# Patient Record
Sex: Female | Born: 1960 | Race: White | Hispanic: No | Marital: Married | Smoking: Current every day smoker
Health system: Southern US, Community
[De-identification: ages and names within clinical notes are randomized; demographics above are authoritative.]

## PROBLEM LIST (undated history)

## (undated) DIAGNOSIS — E039 Hypothyroidism, unspecified: Secondary | ICD-10-CM

## (undated) DIAGNOSIS — A419 Sepsis, unspecified organism: Secondary | ICD-10-CM

## (undated) DIAGNOSIS — E119 Type 2 diabetes mellitus without complications: Secondary | ICD-10-CM

## (undated) DIAGNOSIS — Z720 Tobacco use: Secondary | ICD-10-CM

## (undated) DIAGNOSIS — J45909 Unspecified asthma, uncomplicated: Secondary | ICD-10-CM

## (undated) DIAGNOSIS — I10 Essential (primary) hypertension: Secondary | ICD-10-CM

## (undated) DIAGNOSIS — M1990 Unspecified osteoarthritis, unspecified site: Secondary | ICD-10-CM

## (undated) DIAGNOSIS — E079 Disorder of thyroid, unspecified: Secondary | ICD-10-CM

## (undated) DIAGNOSIS — K760 Fatty (change of) liver, not elsewhere classified: Secondary | ICD-10-CM

## (undated) DIAGNOSIS — R590 Localized enlarged lymph nodes: Secondary | ICD-10-CM

## (undated) HISTORY — PX: TONSILLECTOMY: SUR1361

## (undated) HISTORY — PX: BREAST BIOPSY: SHX20

## (undated) HISTORY — PX: TUBAL LIGATION: SHX77

---

## 2008-08-22 ENCOUNTER — Ambulatory Visit: Payer: Self-pay | Admitting: Unknown Physician Specialty

## 2009-09-12 ENCOUNTER — Ambulatory Visit: Payer: Self-pay | Admitting: Unknown Physician Specialty

## 2011-09-14 ENCOUNTER — Ambulatory Visit: Payer: Self-pay | Admitting: Internal Medicine

## 2012-12-14 ENCOUNTER — Ambulatory Visit: Payer: Self-pay

## 2013-12-15 ENCOUNTER — Ambulatory Visit: Payer: Self-pay | Admitting: Internal Medicine

## 2015-01-30 ENCOUNTER — Ambulatory Visit: Payer: Self-pay | Admitting: Internal Medicine

## 2015-02-12 ENCOUNTER — Ambulatory Visit: Payer: Self-pay | Admitting: Internal Medicine

## 2015-04-22 LAB — SURGICAL PATHOLOGY

## 2015-06-15 ENCOUNTER — Encounter: Payer: Self-pay | Admitting: Emergency Medicine

## 2015-06-15 ENCOUNTER — Emergency Department: Payer: No Typology Code available for payment source

## 2015-06-15 ENCOUNTER — Observation Stay
Admission: EM | Admit: 2015-06-15 | Discharge: 2015-06-18 | Disposition: A | Discharge status: Home / Self Care | Payer: No Typology Code available for payment source | Attending: Internal Medicine | Admitting: Internal Medicine

## 2015-06-15 DIAGNOSIS — R509 Fever, unspecified: Secondary | ICD-10-CM | POA: Diagnosis not present

## 2015-06-15 DIAGNOSIS — R51 Headache: Secondary | ICD-10-CM | POA: Insufficient documentation

## 2015-06-15 DIAGNOSIS — Z888 Allergy status to other drugs, medicaments and biological substances status: Secondary | ICD-10-CM | POA: Diagnosis not present

## 2015-06-15 DIAGNOSIS — Z833 Family history of diabetes mellitus: Secondary | ICD-10-CM | POA: Diagnosis not present

## 2015-06-15 DIAGNOSIS — E785 Hyperlipidemia, unspecified: Secondary | ICD-10-CM | POA: Insufficient documentation

## 2015-06-15 DIAGNOSIS — E7800 Pure hypercholesterolemia, unspecified: Secondary | ICD-10-CM

## 2015-06-15 DIAGNOSIS — E876 Hypokalemia: Secondary | ICD-10-CM | POA: Diagnosis present

## 2015-06-15 DIAGNOSIS — D72829 Elevated white blood cell count, unspecified: Secondary | ICD-10-CM | POA: Insufficient documentation

## 2015-06-15 DIAGNOSIS — A419 Sepsis, unspecified organism: Secondary | ICD-10-CM | POA: Diagnosis present

## 2015-06-15 DIAGNOSIS — I10 Essential (primary) hypertension: Secondary | ICD-10-CM | POA: Insufficient documentation

## 2015-06-15 DIAGNOSIS — E039 Hypothyroidism, unspecified: Secondary | ICD-10-CM | POA: Diagnosis not present

## 2015-06-15 DIAGNOSIS — A938 Other specified arthropod-borne viral fevers: Secondary | ICD-10-CM | POA: Diagnosis not present

## 2015-06-15 DIAGNOSIS — R739 Hyperglycemia, unspecified: Secondary | ICD-10-CM | POA: Insufficient documentation

## 2015-06-15 DIAGNOSIS — F17200 Nicotine dependence, unspecified, uncomplicated: Secondary | ICD-10-CM | POA: Diagnosis not present

## 2015-06-15 DIAGNOSIS — Z79899 Other long term (current) drug therapy: Secondary | ICD-10-CM | POA: Diagnosis not present

## 2015-06-15 HISTORY — DX: Disorder of thyroid, unspecified: E07.9

## 2015-06-15 HISTORY — DX: Essential (primary) hypertension: I10

## 2015-06-15 LAB — BASIC METABOLIC PANEL
Anion gap: 10 (ref 5–15)
BUN: 13 mg/dL (ref 6–20)
CALCIUM: 9.4 mg/dL (ref 8.9–10.3)
CO2: 28 mmol/L (ref 22–32)
Chloride: 100 mmol/L — ABNORMAL LOW (ref 101–111)
Creatinine, Ser: 0.82 mg/dL (ref 0.44–1.00)
GFR calc Af Amer: >60 mL/min (ref >60)
GFR calc non Af Amer: >60 mL/min (ref >60)
GLUCOSE: 134 mg/dL — AB (ref 65–99)
POTASSIUM: 2.4 mmol/L — AB (ref 3.5–5.1)
Sodium: 138 mmol/L (ref 135–145)

## 2015-06-15 LAB — CBC WITH DIFFERENTIAL/PLATELET
BASOS ABS: 0.1 10*3/uL (ref 0–0.1)
Basophils Relative: 1 %
EOS ABS: 0 10*3/uL (ref 0–0.7)
Eosinophils Relative: 0 %
HCT: 44.8 % (ref 35.0–47.0)
Hemoglobin: 15.2 g/dL (ref 12.0–16.0)
Lymphocytes Relative: 13 %
Lymphs Abs: 1.6 10*3/uL (ref 1.0–3.6)
MCH: 31.5 pg (ref 26.0–34.0)
MCHC: 34 g/dL (ref 32.0–36.0)
MCV: 92.7 fL (ref 80.0–100.0)
MONOS PCT: 5 %
Monocytes Absolute: 0.6 10*3/uL (ref 0.2–0.9)
Neutro Abs: 10 10*3/uL — ABNORMAL HIGH (ref 1.4–6.5)
Neutrophils Relative %: 81 %
PLATELETS: 157 10*3/uL (ref 150–440)
RBC: 4.83 MIL/uL (ref 3.80–5.20)
RDW: 13.7 % (ref 11.5–14.5)
WBC: 12.2 10*3/uL — ABNORMAL HIGH (ref 3.6–11.0)

## 2015-06-15 LAB — TSH: TSH: 3.512 u[IU]/mL (ref 0.350–4.500)

## 2015-06-15 LAB — T4, FREE: Free T4: 0.5 ng/dL — ABNORMAL LOW (ref 0.61–1.12)

## 2015-06-15 MED ORDER — POTASSIUM CHLORIDE 20 MEQ PO PACK
PACK | ORAL | Status: AC
  Administered 2015-06-15: 40 meq via ORAL
  Filled 2015-06-15: qty 2

## 2015-06-15 MED ORDER — DOXYCYCLINE HYCLATE 100 MG PO TABS
100.0000 mg | ORAL_TABLET | Freq: Once | ORAL | Status: AC
  Administered 2015-06-15: 100 mg via ORAL

## 2015-06-15 MED ORDER — POTASSIUM CHLORIDE 20 MEQ PO PACK
40.0000 meq | PACK | Freq: Once | ORAL | Status: AC
  Administered 2015-06-15: 40 meq via ORAL

## 2015-06-15 MED ORDER — ACETAMINOPHEN 325 MG PO TABS
ORAL_TABLET | ORAL | Status: AC
  Filled 2015-06-15: qty 2

## 2015-06-15 MED ORDER — ACETAMINOPHEN 325 MG PO TABS
650.0000 mg | ORAL_TABLET | Freq: Four times a day (QID) | ORAL | Status: DC | PRN
  Administered 2015-06-16 – 2015-06-18 (×3): 650 mg via ORAL
  Filled 2015-06-15 (×3): qty 2

## 2015-06-15 MED ORDER — SODIUM CHLORIDE 0.9 % IJ SOLN
3.0000 mL | Freq: Two times a day (BID) | INTRAMUSCULAR | Status: DC
  Administered 2015-06-17: 21:00:00 3 mL via INTRAVENOUS

## 2015-06-15 MED ORDER — POTASSIUM CHLORIDE 10 MEQ/100ML IV SOLN
10.0000 meq | INTRAVENOUS | Status: AC
  Administered 2015-06-15 – 2015-06-16 (×4): 10 meq via INTRAVENOUS
  Filled 2015-06-15 (×4): qty 100

## 2015-06-15 MED ORDER — ONDANSETRON HCL 4 MG PO TABS
4.0000 mg | ORAL_TABLET | Freq: Four times a day (QID) | ORAL | Status: DC | PRN
  Administered 2015-06-16: 18:00:00 4 mg via ORAL
  Filled 2015-06-15: qty 1

## 2015-06-15 MED ORDER — ACETAMINOPHEN 500 MG PO TABS
1000.0000 mg | ORAL_TABLET | Freq: Once | ORAL | Status: AC
  Administered 2015-06-15: 1000 mg via ORAL

## 2015-06-15 MED ORDER — DOXYCYCLINE HYCLATE 100 MG PO TABS
ORAL_TABLET | ORAL | Status: AC
Start: 2015-06-15 — End: 2015-06-15
  Administered 2015-06-15: 100 mg via ORAL
  Filled 2015-06-15: qty 1

## 2015-06-15 MED ORDER — ACETAMINOPHEN 325 MG PO TABS: 650.0000 mg | ORAL_TABLET | Freq: Once | ORAL | Status: DC

## 2015-06-15 MED ORDER — ONDANSETRON HCL 4 MG PO TABS
ORAL_TABLET | ORAL | Status: AC
  Administered 2015-06-15: 4 mg
  Filled 2015-06-15: qty 1

## 2015-06-15 MED ORDER — ONDANSETRON 4 MG PO TBDP: 4.0000 mg | ORAL_TABLET | Freq: Once | ORAL | Status: DC

## 2015-06-15 MED ORDER — MORPHINE SULFATE 2 MG/ML IJ SOLN: 2.0000 mg | INTRAMUSCULAR | Status: DC | PRN

## 2015-06-15 MED ORDER — SODIUM CHLORIDE 0.9 % IV SOLN
INTRAVENOUS | Status: DC
  Administered 2015-06-15 – 2015-06-17 (×3): via INTRAVENOUS

## 2015-06-15 MED ORDER — HEPARIN SODIUM (PORCINE) 5000 UNIT/ML IJ SOLN
5000.0000 [IU] | Freq: Three times a day (TID) | INTRAMUSCULAR | Status: DC
  Administered 2015-06-16 – 2015-06-18 (×7): 5000 [IU] via SUBCUTANEOUS
  Filled 2015-06-15 (×7): qty 1

## 2015-06-15 MED ORDER — ONDANSETRON HCL 4 MG/2ML IJ SOLN: 4.0000 mg | Freq: Four times a day (QID) | INTRAMUSCULAR | Status: DC | PRN

## 2015-06-15 MED ORDER — ACETAMINOPHEN 650 MG RE SUPP: 650.0000 mg | Freq: Four times a day (QID) | RECTAL | Status: DC | PRN

## 2015-06-15 NOTE — ED Notes (Signed)
Report received from megan, rn.  

## 2015-06-15 NOTE — ED Notes (Signed)
Pt. Arrives in triage in wheelchair. Pt. States she noticed what might have been a tick on her right flank.  Pt. States she took it off and threw away.  Pt. States having fever today and feeling latheragic.  Pt. States feeling nauseated but has not vomitted.  Pt. Has a dime shaped ring on right side of abdominal area.

## 2015-06-15 NOTE — H&P (Signed)
[facility] [place] at [facility]   PATIENT NAME: Danielle Franklin    MR#:  [id number]  DATE OF BIRTH:  July 26, 1961   DATE OF ADMISSION:  06/15/2015  PRIMARY CARE PHYSICIAN: Danielle Franklin  REQUESTING/REFERRING PHYSICIAN: Scotty Franklin  CHIEF COMPLAINT:   Chief Complaint  Patient presents with  . Fever    HISTORY OF PRESENT ILLNESS:  Danielle Franklin  is a 54 y.o. female with a known history of hypothyroidism unspecified, essential hypertension presenting with fever. She discovered which he thought was a tick attached to her right flank about 24 hours ago, uncertain of when the original bite may have occurred. She is now experiencing fever, chills, nausea without emesis, fatigue and headache. She denies any sick contacts, any respiratory symptoms or further symptomatology  PAST MEDICAL HISTORY:  History reviewed. No pertinent past medical history.  PAST SURGICAL HISTORY:  History reviewed. No pertinent past surgical history.  SOCIAL HISTORY:   History  Substance Use Topics  . Smoking status: Current Every Day Smoker -- 1.00 packs/day  . Smokeless tobacco: Not on file  . Alcohol Use: No    FAMILY HISTORY:   Family History  Problem Relation Age of Onset  . Diabetes Other     DRUG ALLERGIES:   Allergies  Allergen Reactions  . Caduet [Amlodipine-Atorvastatin]     REVIEW OF SYSTEMS:  REVIEW OF SYSTEMS:  CONSTITUTIONAL: Positive fevers, chills, fatigue, weakness.  EYES: Denies blurred vision, double vision, or eye pain.  EARS, NOSE, THROAT: Denies tinnitus, ear pain, hearing loss.  RESPIRATORY: denies cough, shortness of breath, wheezing  CARDIOVASCULAR: Denies chest pain, palpitations, edema.  GASTROINTESTINAL: Denies nausea, vomiting, diarrhea, abdominal pain.  GENITOURINARY: Denies dysuria, hematuria.  ENDOCRINE: Denies nocturia or thyroid problems. HEMATOLOGIC AND LYMPHATIC: Denies easy bruising or bleeding.  SKIN: Small erythematous/ecchymotic  area right flank no further rash or lesions.  MUSCULOSKELETAL: Denies pain in neck, back, shoulder, knees, hips, or further arthritic symptoms.  NEUROLOGIC: Denies paralysis, paresthesias.  PSYCHIATRIC: Denies anxiety or depressive symptoms. Otherwise full review of systems performed by me is negative.   MEDICATIONS AT HOME:   Prior to Admission medications   Medication Sig Start Date End Date Taking? Authorizing Provider  atenolol (TENORMIN) 100 MG tablet Take 100 mg by mouth daily.   Yes Historical Provider, MD      VITAL SIGNS:  Blood pressure 134/77, pulse 130, temperature 100.7 F (38.2 C), temperature source Oral, resp. rate 20, height 5\' 4"  (1.626 m), weight 208 lb (94.348 kg), SpO2 95 %.  PHYSICAL EXAMINATION:  VITAL SIGNS: Filed Vitals:   06/15/15 1927  BP: 134/77  Pulse: 130  Temp: 100.7 F (38.2 C)  Resp: 20   GENERAL:53 y.o.female currently ill appearing  HEAD: Normocephalic, atraumatic.  EYES: Pupils equal, round, reactive to light. Extraocular muscles intact. No scleral icterus.  MOUTH: Moist mucosal membrane. Dentition intact. No abscess noted.  EAR, NOSE, THROAT: Clear without exudates. No external lesions.  NECK: Supple. No thyromegaly. No nodules. No JVD.  PULMONARY: Clear to ascultation, without wheeze rails or rhonci. No use of accessory muscles, Good respiratory effort. good air entry bilaterally CHEST: Nontender to palpation.  CARDIOVASCULAR: S1 and S2. Regular rate and rhythm. No murmurs, rubs, or gallops. No edema. Pedal pulses 2+ bilaterally.  GASTROINTESTINAL: Soft, nontender, nondistended. No masses. Positive bowel sounds. No hepatosplenomegaly.  MUSCULOSKELETAL: No swelling, clubbing, or edema. Range of motion full in all extremities.  NEUROLOGIC: Cranial nerves II through XII are intact. No gross focal  neurological deficits. Sensation intact. Reflexes intact.  SKIN:  small 1 x 1 cm ecchymotic area with central erythema right flank, Nofurther   ulceration, lesions, rashes, or cyanosis. Skin warm and dry. Turgor intact.  PSYCHIATRIC: Mood, affect within normal limits. The patient is awake, alert and oriented x 3. Insight, judgment intact.    LABORATORY PANEL:   CBC  Recent Labs Lab 06/15/15 2040  WBC 12.2*  HGB 15.2  HCT 44.8  PLT 157   ------------------------------------------------------------------------------------------------------------------  Chemistries   Recent Labs Lab 06/15/15 2040  NA 138  K 2.4*  CL 100*  CO2 28  GLUCOSE 134*  BUN 13  CREATININE 0.82  CALCIUM 9.4   ------------------------------------------------------------------------------------------------------------------  Cardiac Enzymes No results for input(s): TROPONINI in the last 168 hours. ------------------------------------------------------------------------------------------------------------------  RADIOLOGY:  Dg Chest 2 View  06/15/2015   CLINICAL DATA:  54 year old female with fever and lethargy. Possible recent tick bite. Initial encounter.  EXAM: CHEST  2 VIEW  COMPARISON:  None.  FINDINGS: Lung volumes are within normal limits. Normal cardiac size and mediastinal contours. Visualized tracheal air column is within normal limits. No pneumothorax or pulmonary edema. No pleural effusion or consolidation. No acute osseous abnormality identified. Mild scoliosis and exaggeration of lower thoracic kyphosis.  IMPRESSION: No acute cardiopulmonary abnormality.   Electronically Signed   By: Odessa Fleming M.D.   On: 06/15/2015 23:08    EKG:  No orders found for this or any previous visit.  IMPRESSION AND PLAN:   54 year old Caucasian female history of hypertension essential, hypothyroidism unspecified presenting with fever.  1. Sepsis, meeting septic criteria by heart rate, respiratory rate, leukocytosis present on arrival. Source unclear possibly tick borne illness panculture. Broad-spectrum antibiotics including doxycycline and taper  antibiotics when culture data returns.Continue IV fluid hydration to keep mean arterial pressure greater than 65.    2. Essential hypertension: Continue atenolol   3. Hypokalemia replace goal 4-5 4. Hypothyroidism unspecified: Continue home medications 5. Venous thromboembolism prophylactic: Heparin subcutaneous    All the records are reviewed and case discussed with ED provider. Management plans discussed with the patient, family and they are in agreement.  CODE STATUS: full  TOTAL TIME TAKING CARE OF THIS PATIENT: 35 minutes.    Hower,  Mardi Mainland.D on 06/15/2015 at 11:32 PM  Between 7am to 6pm - Pager - [phone]  After 6pm: House Pager: - [phone]  [facility]  Office  [phone]  CC: Primary care physician;  Danielle Franklin

## 2015-06-15 NOTE — ED Provider Notes (Addendum)
EKG interpreted by me Sinus tachycardia rate 108, normal axis intervals, normal QRS ST segments and T waves.  Sharman Cheek, MD 06/15/15 2209  Medical screening examination/treatment/procedure(s) were conducted as a shared visit with non-physician practitioner(s) and myself.  I personally evaluated the patient during the encounter.  Care discussed with the mid-level practitioner, patient interviewed and examined by myself. Patient has fever and tachycardia with a small skin lesion on the right abdomen. No significant focal tenderness. Lungs are clear, abdomen benign.  Patient presents with SIRS without a clear source. Labs only significant for severe hypokalemia. No clear infectious source indicates sepsis at this time. TSH is within normal and this does not appear to be consistent with thyroid storm.  Patient to be admitted to hospitalist services for further evaluation and management.  Sharman Cheek, MD 06/15/15 2245

## 2015-06-15 NOTE — ED Notes (Signed)
Pt on cardiac monitor.

## 2015-06-15 NOTE — ED Notes (Signed)
PA notified of critical potassium of 2.4

## 2015-06-15 NOTE — ED Notes (Signed)
Potassium reduced to 80ml/hr, pt complains of burning.

## 2015-06-15 NOTE — ED Provider Notes (Signed)
[facility] Emergency Department Provider Note ____________________________________________  Time seen: 2040  I have reviewed the triage vital signs and the nursing notes.  HISTORY  Chief Complaint  Fever  HPI Danielle Franklin is a 54 y.o. female reports to the ED escorted by her family, with complaints of fever and fatigue since yesterday. Of note is that she recalls pulling a very small tick off of her side yesterday prior to the onset of her symptoms. She also noted a small dime-sized bruise to the right abdomen, which she reports is not itchy or painful at this time. She is not sure if that is where she pulled the tick. Her fever started today, as well as some chills and nausea without vomiting and overall she feels more tired and states that that began yesterday. Her last oral intake was about 11 AM this morning when she was able to eat and keep down a hot pocket. She is here for evaluation and management of her symptoms. She gives a history of hypothyroidism and currently a every day smoker.  No past medical history on file.  Patient Active Problem List   Diagnosis Date Noted  . Hypothyroid 06/15/2015  . Elevated cholesterol 06/15/2015  . Sepsis 06/15/2015   No past surgical history on file.  Current Outpatient Rx  Name  Route  Sig  Dispense  Refill  . atenolol (TENORMIN) 100 MG tablet   Oral   Take 100 mg by mouth daily.          Allergies Caduet  No family history on file.  Social History History  Substance Use Topics  . Smoking status: Current Every Day Smoker -- 1.00 packs/day  . Smokeless tobacco: Not on file  . Alcohol Use: No   Review of Systems  Constitutional: Positive for fever and fatigue. Eyes: Negative for visual changes. ENT: Negative for sore throat. Cardiovascular: Negative for chest pain. Elevated heart rate. Respiratory: Negative for shortness of breath. Gastrointestinal: Negative for abdominal pain and diarrhea.  Positive for nausea. Genitourinary: Negative for dysuria. Musculoskeletal: Negative for back pain. Skin: Negative for rash.  Neurological: Negative for headaches, focal weakness or numbness. ____________________________________________  PHYSICAL EXAM:  VITAL SIGNS: ED Triage Vitals  Enc Vitals Group     BP 06/15/15 1927 134/77 mmHg     Pulse Rate 06/15/15 1927 130     Resp 06/15/15 1927 20     Temp 06/15/15 1927 100.7 F (38.2 C)     Temp Source 06/15/15 1927 Oral     SpO2 06/15/15 1927 95 %     Weight 06/15/15 1927 208 lb (94.348 kg)     Height 06/15/15 1927 5\' 4"  (1.626 m)     Head Cir --      Peak Flow --      Pain Score 06/15/15 1929 0     Pain Loc --      Pain Edu? --      Excl. in GC? --    Constitutional: Alert and oriented. Well appearing and in no distress. Eyes: Conjunctivae are normal. PERRL. Normal extraocular movements. ENT   Head: Normocephalic and atraumatic.   Nose: No congestion/rhinnorhea.   Mouth/Throat: Mucous membranes are moist.   Neck: Supple. No thyromegaly. Hematological/Lymphatic/Immunilogical: No cervical lymphadenopathy. Cardiovascular: Tachycardic rate, regular rhythm without murmur, palpitation, or gallup.  Respiratory: Normal respiratory effort. No wheezes/rales/rhonchi. Gastrointestinal: Soft and nontender. No distention. Musculoskeletal: Nontender with normal range of motion in all extremities.  Neurologic:  Normal gait without  ataxia. Normal speech and language. No gross focal neurologic deficits are appreciated. CN II-XII grossly intact Skin:  Skin is warm, dry and intact. No rash noted. Small, 1 cm, ecchymotic bruise to the right abdomen without signs of infection, central clearing, or abscess.  Psychiatric: Mood and affect are normal. Patient exhibits appropriate insight and judgment. ____________________________________________    LABS (pertinent positives/negatives) Labs Reviewed  CBC WITH DIFFERENTIAL/PLATELET -  Abnormal; Notable for the following:    WBC 12.2 (*)    Neutro Abs 10.0 (*)    All other components within normal limits  BASIC METABOLIC PANEL - Abnormal; Notable for the following:    Potassium 2.4 (*)    Chloride 100 (*)    Glucose, Bld 134 (*)    All other components within normal limits  T4, FREE - Abnormal; Notable for the following:    Free T4 0.50 (*)    All other components within normal limits  TSH  B. BURGDORFI ANTIBODIES  URINALYSIS COMPLETEWITH MICROSCOPIC ([facility] ONLY)  CBC  CREATININE, SERUM  Blood cultures x 2 - pending ___________________________________________  PROCEDURES Tylenol 1000 mg PO Zofran 4 mg PO Doxycycline  PO NS 1000 ml  ED ECG REPORT   Date: 06/15/2015  EKG Time:2042  Rate: 108  Rhythm: sinus tachycardia,  normal EKG, normal sinus rhythm, unchanged from previous tracings, there are no previous tracings available for comparison  Axis: normal  Intervals:none  ST&T Change: none  Narrative Interpretation: sinus tachy ____________________________________________  INITIAL IMPRESSION / ASSESSMENT AND PLAN / ED COURSE  Lab results to patient. Critical low potassium. Will discuss admission with attending for correction or lab with IV potassium. Clinical picture less consistent with tick disease given the less than 24-hour onset of symptoms after finding a small tick.   Discussed case with Dr. Scotty Court, and Dr. Clint Guy to evaluate for admission. ____________________________________________  FINAL CLINICAL IMPRESSION(S) / ED DIAGNOSES  Final diagnoses:  Acute hypokalemia     Lissa Hoard, PA-C 06/15/15 2325  Charlesetta Ivory [place], PA-C 06/15/15 2330  Sharman Cheek, MD 06/16/15 0005

## 2015-06-16 DIAGNOSIS — E876 Hypokalemia: Secondary | ICD-10-CM

## 2015-06-16 DIAGNOSIS — D72829 Elevated white blood cell count, unspecified: Secondary | ICD-10-CM

## 2015-06-16 DIAGNOSIS — A938 Other specified arthropod-borne viral fevers: Secondary | ICD-10-CM

## 2015-06-16 DIAGNOSIS — R739 Hyperglycemia, unspecified: Secondary | ICD-10-CM

## 2015-06-16 LAB — URINALYSIS COMPLETE WITH MICROSCOPIC (ARMC ONLY)
Bilirubin Urine: NEGATIVE
Glucose, UA: NEGATIVE mg/dL
HGB URINE DIPSTICK: NEGATIVE
Ketones, ur: NEGATIVE mg/dL
LEUKOCYTES UA: NEGATIVE
Nitrite: NEGATIVE
Protein, ur: NEGATIVE mg/dL
RBC / HPF: NONE SEEN RBC/hpf (ref 0–5)
Specific Gravity, Urine: 1.003 — ABNORMAL LOW (ref 1.005–1.030)
pH: 6 (ref 5.0–8.0)

## 2015-06-16 LAB — CBC WITH DIFFERENTIAL/PLATELET
Basophils Absolute: 0 10*3/uL (ref 0–0.1)
Basophils Relative: 0 %
EOS PCT: 0 %
Eosinophils Absolute: 0 10*3/uL (ref 0–0.7)
HEMATOCRIT: 40.4 % (ref 35.0–47.0)
HEMOGLOBIN: 13.5 g/dL (ref 12.0–16.0)
LYMPHS PCT: 15 %
Lymphs Abs: 1.6 10*3/uL (ref 1.0–3.6)
MCH: 31.4 pg (ref 26.0–34.0)
MCHC: 33.5 g/dL (ref 32.0–36.0)
MCV: 93.9 fL (ref 80.0–100.0)
Monocytes Absolute: 0.7 10*3/uL (ref 0.2–0.9)
Monocytes Relative: 7 %
NEUTROS ABS: 8 10*3/uL — AB (ref 1.4–6.5)
Neutrophils Relative %: 78 %
PLATELETS: 126 10*3/uL — AB (ref 150–440)
RBC: 4.3 MIL/uL (ref 3.80–5.20)
RDW: 14 % (ref 11.5–14.5)
WBC: 10.3 10*3/uL (ref 3.6–11.0)

## 2015-06-16 LAB — POTASSIUM: Potassium: 3.1 mmol/L — ABNORMAL LOW (ref 3.5–5.1)

## 2015-06-16 LAB — MAGNESIUM: MAGNESIUM: 1.5 mg/dL — AB (ref 1.7–2.4)

## 2015-06-16 MED ORDER — ATENOLOL 50 MG PO TABS
100.0000 mg | ORAL_TABLET | Freq: Every day | ORAL | Status: DC
  Administered 2015-06-16: 10:00:00 100 mg via ORAL
  Administered 2015-06-17: 08:00:00 50 mg via ORAL
  Filled 2015-06-16 (×3): qty 2

## 2015-06-16 MED ORDER — IBUPROFEN 600 MG PO TABS
600.0000 mg | ORAL_TABLET | Freq: Four times a day (QID) | ORAL | Status: DC | PRN
  Administered 2015-06-16: 600 mg via ORAL
  Filled 2015-06-16: qty 1

## 2015-06-16 MED ORDER — ATENOLOL 100 MG PO TABS: 100.0000 mg | ORAL_TABLET | Freq: Every day | ORAL | Status: DC

## 2015-06-16 MED ORDER — DOXYCYCLINE HYCLATE 100 MG PO TABS
100.0000 mg | ORAL_TABLET | Freq: Two times a day (BID) | ORAL | Status: DC
  Administered 2015-06-16 – 2015-06-17 (×3): 100 mg via ORAL
  Administered 2015-06-17: 08:00:00 50 mg via ORAL
  Administered 2015-06-18: 100 mg via ORAL
  Filled 2015-06-16 (×5): qty 1

## 2015-06-16 MED ORDER — ZOLPIDEM TARTRATE 5 MG PO TABS
5.0000 mg | ORAL_TABLET | Freq: Every evening | ORAL | Status: DC | PRN
  Administered 2015-06-17: 21:00:00 5 mg via ORAL
  Filled 2015-06-16: qty 1

## 2015-06-16 MED ORDER — DOXYCYCLINE HYCLATE 100 MG PO TABS: 100.0000 mg | ORAL_TABLET | Freq: Two times a day (BID) | ORAL | Status: DC

## 2015-06-16 MED ORDER — POTASSIUM CHLORIDE CRYS ER 20 MEQ PO TBCR
40.0000 meq | EXTENDED_RELEASE_TABLET | Freq: Once | ORAL | Status: AC
  Administered 2015-06-16: 40 meq via ORAL
  Filled 2015-06-16: qty 2

## 2015-06-16 MED ORDER — ONDANSETRON HCL 4 MG PO TABS: 4.0000 mg | ORAL_TABLET | Freq: Four times a day (QID) | ORAL | Status: DC | PRN

## 2015-06-16 NOTE — Progress Notes (Signed)
[facility] - [place] at [facility]   PATIENT NAME: Danielle Franklin    MR#:  [id number]  DATE OF BIRTH:  1961/08/28  SUBJECTIVE:  CHIEF COMPLAINT:   Chief Complaint  Patient presents with  . Fever   Patient presented with fever, chills, nausea, headache and feeling weak. She had recently noted tach to right flank, which left the bite site inflammation. Does not feel well today since did not sleep well at night.Denies any other clinical abnormalities . Admits of intermittent headaches as well as nausea. However, able to eat some breakfast .   Review of Systems  Constitutional: Positive for fever, chills and malaise/fatigue.  Neurological: Positive for weakness.    VITAL SIGNS: Blood pressure 107/51, pulse 102, temperature 100.1 F (37.8 C), temperature source Oral, resp. rate 18, height  (1.626 m), weight 95.89 kg (211 lb 6.4 oz), SpO2 91 %.  PHYSICAL EXAMINATION:   GENERAL:  54 y.o.-year-old patient lying in the bed with no acute distress. Weekend overall uncomfortable and the distent expression on her face  EYES: Pupils equal, round, reactive to light and accommodation. No scleral icterus. Extraocular muscles intact.  HEENT: Head atraumatic, normocephalic. Oropharynx and nasopharynx clear.  NECK:  Supple, no jugular venous distention. No thyroid enlargement, no tenderness.  LUNGS: Normal breath sounds bilaterally, no wheezing, rales,rhonchi or crepitation. No use of accessory muscles of respiration.  CARDIOVASCULAR: S1, S2 normal. No murmurs, rubs, or gallops.  ABDOMEN: Soft, nontender, nondistended. Bowel sounds present. No organomegaly or mass.  EXTREMITIES: No pedal edema, cyanosis, or clubbing.  NEUROLOGIC: Cranial nerves II through XII are intact. Muscle strength 5/5 in all extremities. Sensation intact. Gait not checked.  PSYCHIATRIC: The patient is alert and oriented x 3.  SKIN: No obvious rash, lesion, or ulcer. Papular bite site inflammation  with some bruising. No significant tenderness or any satellite rashes.   ORDERS/RESULTS REVIEWED:   CBC  Recent Labs Lab 06/15/15 2040  WBC 12.2*  HGB 15.2  HCT 44.8  PLT 157  MCV 92.7  MCH 31.5  MCHC 34.0  RDW 13.7  LYMPHSABS 1.6  MONOABS 0.6  EOSABS 0.0  BASOSABS 0.1   ------------------------------------------------------------------------------------------------------------------  Chemistries   Recent Labs Lab 06/15/15 2040  NA 138  K 2.4*  CL 100*  CO2 28  GLUCOSE 134*  BUN 13  CREATININE 0.82  CALCIUM 9.4   ------------------------------------------------------------------------------------------------------------------ estimated creatinine clearance is 88.2 mL/min (by C-G formula based on Cr of 0.82). ------------------------------------------------------------------------------------------------------------------  Recent Labs  06/15/15 2040  TSH 3.512    Cardiac Enzymes No results for input(s): CKMB, TROPONINI, MYOGLOBIN in the last 168 hours.  Invalid input(s): CK ------------------------------------------------------------------------------------------------------------------ Invalid input(s): POCBNP ---------------------------------------------------------------------------------------------------------------  RADIOLOGY: Dg Chest 2 View  06/15/2015   CLINICAL DATA:  54 year old female with fever and lethargy. Possible recent tick bite. Initial encounter.  EXAM: CHEST  2 VIEW  COMPARISON:  None.  FINDINGS: Lung volumes are within normal limits. Normal cardiac size and mediastinal contours. Visualized tracheal air column is within normal limits. No pneumothorax or pulmonary edema. No pleural effusion or consolidation. No acute osseous abnormality identified. Mild scoliosis and exaggeration of lower thoracic kyphosis.  IMPRESSION: No acute cardiopulmonary abnormality.   Electronically Signed   By: Odessa Fleming M.D.   On: 06/15/2015 23:08    EKG: No  orders found for this or any previous visit.  ASSESSMENT AND PLAN:  Active Problems:   Hypothyroid   Elevated cholesterol   Sepsis 1. Suspected tick fever, continue doxycycline to  complete 14 day course. Awaiting for serologic results for Lyme disease as well as [facility] spotted fever, also ehrlichia  Serology, follow-up with infectious disease specialist or primary care physician for serology results as outpatient 2. Hypokalemia. Check potassium level now, get magnesium level as well as patient was on chlorthalidone 3. Hyperglycemia. Get hemoglobin A1c 4 . Leukocytosis. Check white blood cell count with antibiotics therapy 5. Hyperlipidemia. Continue Mevacor    Management plans discussed with the patient, family and they are in agreement.   DRUG ALLERGIES:  Allergies  Allergen Reactions  . Caduet [Amlodipine-Atorvastatin]     CODE STATUS:     Code Status Orders        Start     Ordered   06/15/15 2325  Full code   Continuous     06/15/15 2324      TOTAL TIME TAKING CARE OF THIS PATIENT: 40  minutes.    Katharina Caper M.D on 06/16/2015 at 9:16 AM  Between 7am to 6pm - Pager - [phone]  After 6pm go to [email or web address] - password [email or web address]  [facility]  Office  [phone]  CC: Primary care physician; Pcp Not In System

## 2015-06-16 NOTE — Plan of Care (Signed)
Problem: Discharge Progression Outcomes Goal: Discharge plan in place and appropriate Individualization: Outcome: Progressing Individualization: Outcome: Progressing Pt lives at home with spouse and son.    Pt has hx of hypothyroidism and HTN, controlled by home meds. Pt is a moderate fall risk. Able to call for assistance if needed.

## 2015-06-16 NOTE — Plan of Care (Signed)
Problem: Discharge Progression Outcomes Goal: Discharge plan in place and appropriate Individualization: Outcome: Progressing Pt lives at home with spouse and son.   Pt has hx of hypothyroidism and HTN, controlled by home meds. Pt is a moderate fall risk. Able to call for assistance if needed.  Goal: Other Discharge Outcomes/Goals Outcome: Progressing Plan of care progress to goal: Discharge plan: pt will be discharged to home. Pain-pt has had no c/o pain since arrival to room. Hemodynamically stable-Potassium down to 2.4, PO and IV supplements being given per order, WBC's elevated 12.2, first dose of doxycycline given in the ED and IVF infusing per MD order. Continue to monitor labs.  Complications resolved- Potassium low, supplements given improvement pending morning lab results. Diet- Tolerating diet, no c/o nausea or vomiting.

## 2015-06-16 NOTE — Plan of Care (Signed)
Problem: Discharge Progression Outcomes Goal: Other Discharge Outcomes/Goals Outcome: Progressing Pt rested most of shift.  Spiked a fever of 102 - came down w/tylenol.  Awaiting lab results - testing for Lyme disease and [place] spotted fever.  K+ still low but improved to 3.1. WBC improved to 10.3.  NO N&V or c/o pain.

## 2015-06-16 NOTE — Plan of Care (Signed)
Problem: Discharge Progression Outcomes Goal: Discharge plan in place and appropriate Individualization:  Pt lives at home with spouse and son.  Pt has hx of hypothyroidism and HTN, controlled by home meds. Pt is a moderate fall risk. Able to call for assistance if needed.

## 2015-06-17 LAB — POTASSIUM: POTASSIUM: 3.5 mmol/L (ref 3.5–5.1)

## 2015-06-17 LAB — B. BURGDORFI ANTIBODIES: B burgdorferi Ab IgG+IgM: <0.91 {ISR} (ref 0.00–0.90)

## 2015-06-17 LAB — MAGNESIUM: MAGNESIUM: 2.5 mg/dL — AB (ref 1.7–2.4)

## 2015-06-17 MED ORDER — CHLORTHALIDONE 25 MG PO TABS
25.0000 mg | ORAL_TABLET | Freq: Every day | ORAL | Status: DC
  Administered 2015-06-17: 16:00:00 25 mg via ORAL
  Filled 2015-06-17 (×2): qty 1

## 2015-06-17 MED ORDER — MAGNESIUM SULFATE 4 GM/100ML IV SOLN
4.0000 g | Freq: Once | INTRAVENOUS | Status: AC
  Administered 2015-06-17: 11:00:00 4 g via INTRAVENOUS
  Filled 2015-06-17: qty 100

## 2015-06-17 MED ORDER — THYROID 60 MG PO TABS
120.0000 mg | ORAL_TABLET | Freq: Every day | ORAL | Status: DC
  Administered 2015-06-18: 120 mg via ORAL
  Filled 2015-06-17: qty 2
  Filled 2015-06-17: qty 1

## 2015-06-17 MED ORDER — PRAVASTATIN SODIUM 20 MG PO TABS
40.0000 mg | ORAL_TABLET | Freq: Every day | ORAL | Status: DC
  Administered 2015-06-17: 40 mg via ORAL
  Filled 2015-06-17: qty 2

## 2015-06-17 MED ORDER — POTASSIUM CHLORIDE CRYS ER 20 MEQ PO TBCR
40.0000 meq | EXTENDED_RELEASE_TABLET | Freq: Once | ORAL | Status: AC
  Administered 2015-06-17: 40 meq via ORAL
  Filled 2015-06-17: qty 2

## 2015-06-17 NOTE — Plan of Care (Signed)
Problem: Discharge Progression Outcomes Goal: Discharge plan in place and appropriate Individualization: Pt prefers to be called Danielle Franklin Pt lives at home with spouse and son.     Pt has hx of hypothyroidism and HTN, controlled by home meds. Pt is a moderate fall risk. Able to call for assistance if needed    Goal: Other Discharge Outcomes/Goals Plan of Care Progress to Goal:  Pt without c/o pain at this time. Pt lethargic but arousable. Pt tolerating pos well. tmax 99.1

## 2015-06-17 NOTE — Progress Notes (Addendum)
[facility] Physicians - [place] at [facility]   PATIENT NAME: Danielle Franklin    MR#:  [id number]  DATE OF BIRTH:  1961-09-13  SUBJECTIVE:  CHIEF COMPLAINT:   Chief Complaint  Patient presents with  . Fever   MAXIMUM TEMPERATURE of 103.1 last evening at 6:00, K & mg low.  Tachycardiac.  Feels tired REVIEW OF SYSTEMS:  Review of Systems  Constitutional: Positive for fever and malaise/fatigue. Negative for weight loss and diaphoresis.  HENT: Negative for ear discharge, ear pain, hearing loss, nosebleeds, sore throat and tinnitus.   Eyes: Negative for blurred vision and pain.  Respiratory: Negative for cough, hemoptysis, shortness of breath and wheezing.   Cardiovascular: Negative for chest pain, palpitations, orthopnea and leg swelling.  Gastrointestinal: Negative for heartburn, nausea, vomiting, abdominal pain, diarrhea, constipation and blood in stool.  Genitourinary: Negative for dysuria, urgency and frequency.  Musculoskeletal: Negative for myalgias and back pain.  Skin: Negative for itching and rash.  Neurological: Positive for weakness. Negative for dizziness, tingling, tremors, focal weakness, seizures and headaches.  Psychiatric/Behavioral: Negative for depression. The patient is not nervous/anxious.    DRUG ALLERGIES:   Allergies  Allergen Reactions  . Caduet [Amlodipine-Atorvastatin]    VITALS:  Blood pressure 156/69, pulse 112, temperature 98 F (36.7 C), temperature source Oral, resp. rate 18, height 5\' 4"  (1.626 m), weight 95.89 kg (211 lb 6.4 oz), SpO2 92 %. PHYSICAL EXAMINATION:  Physical Exam  Constitutional: She is oriented to person, place, and time and well-developed, well-nourished, and in no distress.  HENT:  Head: Normocephalic and atraumatic.  Eyes: Conjunctivae and EOM are normal. Pupils are equal, round, and reactive to light.  Neck: Normal range of motion. Neck supple. No tracheal deviation present. No thyromegaly present.  Cardiovascular:  Normal rate, regular rhythm and normal heart sounds.   Pulmonary/Chest: Effort normal and breath sounds normal. No respiratory distress. She has no wheezes. She exhibits no tenderness.  Abdominal: Soft. Bowel sounds are normal. She exhibits no distension. There is no tenderness.  Musculoskeletal: Normal range of motion.  Neurological: She is alert and oriented to person, place, and time. No cranial nerve deficit.  Skin: Skin is warm and dry. No rash noted.  Psychiatric: Mood and affect normal.   LABORATORY PANEL:   CBC  Recent Labs Lab 06/16/15 0935  WBC 10.3  HGB 13.5  HCT 40.4  PLT 126*   ------------------------------------------------------------------------------------------------------------------ Chemistries   Recent Labs Lab 06/15/15 2040 06/16/15 0935  NA 138  --   K 2.4* 3.1*  CL 100*  --   CO2 28  --   GLUCOSE 134*  --   BUN 13  --   CREATININE 0.82  --   CALCIUM 9.4  --   MG  --  1.5*   ASSESSMENT AND PLAN:   1. Suspected tick fever: Continue doxycycline to complete 14 day course. Pending serologic results for Lyme disease as well as [facility] spotted fever, also ehrlichia Serology, still had fever up to 103 last evening.  2. Hypokalemia: Replete and recheck, likely due to chlorthalidone  3. Leukocytosis: Improving white blood cell count with antibiotics therapy  5. Hyperlipidemia. Continue Mevacor  All the records are reviewed and case discussed with Care Management/Social Worker. Management plans discussed with the patient and she is in agreement.  CODE STATUS: Full Code  TOTAL TIME TAKING CARE OF THIS PATIENT: 25 minutes.   More than 50% of the time was spent in counseling/coordination of care: YES  POSSIBLE  D/C IN AM, DEPENDING ON CLINICAL CONDITION.  Would like to see fever curve improving and electrolytes repleted or close to normal   [facility], Illene Sweeting M.D on 06/17/2015 at 12:24 PM  Between 7am to 6pm - Pager - [phone]  After 6pm go  to [email or web address] - password [email or web address]  [facility]  Office  [phone]  CC:  Primary care physician; Pcp Not In System

## 2015-06-17 NOTE — Plan of Care (Signed)
Problem: Discharge Progression Outcomes Goal: Other Discharge Outcomes/Goals Outcome: Progressing Plan of care progress to goal: Discharge plan: pt will be discharged to home. Pain-No c/o pain this shift. Hemodynamically stable-WBC's improving, potassium improved but continues to be low at 3.1 IVF infusing per order. Doxycycline given per order. Continue to monitor labs.   Complications resolved- Febrile 101, improved with Ibuprofen. Diet- Tolerating diet, no c/o nausea or vomiting.

## 2015-06-18 LAB — ROCKY MTN SPOTTED FVR ABS PNL(IGG+IGM)
RMSF IGG: NEGATIVE
RMSF IgM: 0.24 index (ref 0.00–0.89)

## 2015-06-18 LAB — EHRLICHIA ANTIBODY PANEL
E chaffeensis (HGE) Ab, IgG: NEGATIVE
E chaffeensis (HGE) Ab, IgM: NEGATIVE
E. CHAFFEENSIS IGG AB: NEGATIVE
E. Chaffeensis (HME) IgM Titer: NEGATIVE

## 2015-06-18 LAB — MAGNESIUM: Magnesium: 2.3 mg/dL (ref 1.7–2.4)

## 2015-06-18 LAB — POTASSIUM: POTASSIUM: 3.5 mmol/L (ref 3.5–5.1)

## 2015-06-18 MED ORDER — ATENOLOL 100 MG PO TABS: 100.0000 mg | ORAL_TABLET | Freq: Every day | ORAL | Status: DC

## 2015-06-18 MED ORDER — DOXYCYCLINE HYCLATE 100 MG PO TABS: 100.0000 mg | ORAL_TABLET | Freq: Two times a day (BID) | ORAL | Status: DC

## 2015-06-18 NOTE — Plan of Care (Signed)
Problem: Discharge Progression Outcomes Goal: Other Discharge Outcomes/Goals Outcome: Completed/Met Date Met:  06/18/15 Received order to discharge patient to home No c/o pain this shift VSS  Patient Potasium and Thyroid levels are WNL Patient is tolerating diet well Discharged to home with family member IV removed

## 2015-06-18 NOTE — Discharge Summary (Signed)
[facility] Physicians - [place] at [facility]   PATIENT NAME: Danielle Franklin    MR#:  [id number]  DATE OF BIRTH:  [birth year]  DATE OF ADMISSION:  06/15/2015    DATE OF DISCHARGE: 06/18/2015   ADMITTING PHYSICIAN: Wyatt Haste, MD  PRIMARY CARE PHYSICIAN: Yates Decamp B III  ADMISSION DIAGNOSIS:  Acute hypokalemia [E87.6]  DISCHARGE DIAGNOSIS:  Principal Problem:   Sepsis Active Problems:   Hypothyroid   Elevated cholesterol   Tick borne fever   Hypokalemia   Hyperglycemia   Leukocytosis  SECONDARY DIAGNOSIS:   Past Medical History  Diagnosis Date  . Hypertension   . Thyroid disease     HOSPITAL COURSE:  Patient is a 54 year old female with above-mentioned medical problem was admitted for fever and sepsis.  Dr. Onalee Hua hower's dictated history and physical for further details.  This was thought to be secondary to tick bite.  Patient was started on doxycycline and she responded well to same.  Patient also have a low electrolytes including potassium and magnesium, which were repleted and resolved.  Her electrolyte disturbances were thought to be secondary to her being on diuretic.  her chlorthalidone was stopped and atenolol dose was increased for better blood pressure control.  She is doing much better and is being discharged home in stable condition.  She is agreeable with the discharge plans.    DISCHARGE CONDITIONS:   good  CONSULTS OBTAINED:    none  DRUG ALLERGIES:   Allergies  Allergen Reactions  . Caduet [Amlodipine-Atorvastatin]    DISCHARGE MEDICATIONS:   Current Discharge Medication List    START taking these medications   Details  doxycycline (VIBRA-TABS) 100 MG tablet Take 1 tablet (100 mg total) by mouth every 12 (twelve) hours. Qty: 24 tablet, Refills: 0    ondansetron (ZOFRAN) 4 MG tablet Take 1 tablet (4 mg total) by mouth every 6 (six) hours as needed for nausea. Qty: 20 tablet, Refills: 0      CONTINUE these medications  which have CHANGED   Details  atenolol (TENORMIN) 100 MG tablet Take 1 tablet (100 mg total) by mouth daily. Qty: 30 tablet, Refills: 0      CONTINUE these medications which have NOT CHANGED   Details  ARMOUR THYROID 90 MG tablet Take 3 tablets by mouth at bedtime.    lovastatin (MEVACOR) 40 MG tablet Take 1 tablet by mouth at bedtime.      STOP taking these medications     atenolol-chlorthalidone (TENORETIC) 50-25 MG per tablet          DIET:  Cardiac diet  DISCHARGE CONDITION:  Good  ACTIVITY:  Activity as tolerated  OXYGEN:  Home Oxygen: No.   Oxygen Delivery: room air  DISCHARGE LOCATION:  home   If you experience worsening of your admission symptoms, develop shortness of breath, life threatening emergency, suicidal or homicidal thoughts you must seek medical attention immediately by calling 911 or calling your MD immediately  if symptoms less severe.  You Must read complete instructions/literature along with all the possible adverse reactions/side effects for all the Medicines you take and that have been prescribed to you. Take any new Medicines after you have completely understood and accpet all the possible adverse reactions/side effects.   Please note  You were cared for by a hospitalist during your hospital stay. If you have any questions about your discharge medications or the care you received while you were in the hospital after you are discharged, you  can call the unit and asked to speak with the hospitalist on call if the hospitalist that took care of you is not available. Once you are discharged, your primary care physician will handle any further medical issues. Please note that NO REFILLS for any discharge medications will be authorized once you are discharged, as it is imperative that you return to your primary care physician (or establish a relationship with a primary care physician if you do not have one) for your aftercare needs so that they can  reassess your need for medications and monitor your lab values.   On the day of Discharge:   VITAL SIGNS:  Blood pressure 102/55, pulse 69, temperature 97.5 F (36.4 C), temperature source Oral, resp. rate 18, height  (1.626 m), weight 95.89 kg (211 lb 6.4 oz), SpO2 92 %.  I/O:   Intake/Output Summary (Last 24 hours) at 06/18/15 0716 Last data filed at 06/18/15 0402  Gross per 24 hour  Intake   1366 ml  Output   2175 ml  Net   -809 ml   PHYSICAL EXAMINATION:  GENERAL:  54 y.o.-year-old patient lying in the bed with no acute distress.  EYES: Pupils equal, round, reactive to light and accommodation. No scleral icterus. Extraocular muscles intact.  HEENT: Head atraumatic, normocephalic. Oropharynx and nasopharynx clear.  NECK:  Supple, no jugular venous distention. No thyroid enlargement, no tenderness.  LUNGS: Normal breath sounds bilaterally, no wheezing, rales,rhonchi or crepitation. No use of accessory muscles of respiration.  CARDIOVASCULAR: S1, S2 normal. No murmurs, rubs, or gallops.  ABDOMEN: Soft, non-tender, non-distended. Bowel sounds present. No organomegaly or mass.  EXTREMITIES: No pedal edema, cyanosis, or clubbing.  NEUROLOGIC: Cranial nerves II through XII are intact. Muscle strength 5/5 in all extremities. Sensation intact. Gait not checked.  PSYCHIATRIC: The patient is alert and oriented x 3.  SKIN: No obvious rash, lesion, or ulcer.  DATA REVIEW:   CBC  Recent Labs Lab 06/16/15 0935  WBC 10.3  HGB 13.5  HCT 40.4  PLT 126*    Chemistries   Recent Labs Lab 06/15/15 2040  06/18/15 0412  NA 138  --   --   K 2.4*  < > 3.5  CL 100*  --   --   CO2 28  --   --   GLUCOSE 134*  --   --   BUN 13  --   --   CREATININE 0.82  --   --   CALCIUM 9.4  --   --   MG  --   < > 2.3  < > = values in this interval not displayed.  Management plans discussed with the patient and she is in agreement.  CODE STATUS: Full Code  TOTAL TIME TAKING CARE OF THIS  PATIENT: 55  minutes.    [facility], Jacque Garrels M.D on 06/18/2015 at 7:16 AM  Between 7am to 6pm - Pager - [phone]  After 6pm go to [email or web address] - password [email or web address] [facility]  [facility]  Office  [phone]  CC: Primary care physician; Elmo Putt III

## 2015-06-18 NOTE — Discharge Instructions (Signed)
Tick Bite Information Ticks are insects that attach themselves to the skin. There are many types of ticks. Common types include wood ticks and deer ticks. Sometimes, ticks carry diseases that can make a person very ill. The most common places for ticks to attach themselves are the scalp, neck, armpits, waist, and groin.  HOW CAN YOU PREVENT TICK BITES? Take these steps to help prevent tick bites when you are outdoors:  Wear long sleeves and long pants.  Wear white clothes so you can see ticks more easily.  Tuck your pant legs into your socks.  If walking on a trail, stay in the middle of the trail to avoid brushing against bushes.  Avoid walking through areas with long grass.  Put bug spray on all skin that is showing and along boot tops, pant legs, and sleeve cuffs.  Check clothes, hair, and skin often and before going inside.  Brush off any ticks that are not attached.  Take a shower or bath as soon as possible after being outdoors. HOW SHOULD YOU REMOVE A TICK? Ticks should be removed as soon as possible to help prevent diseases. 1. If latex gloves are available, put them on before trying to remove a tick. 2. Use tweezers to grasp the tick as close to the skin as possible. You may also use curved forceps or a tick removal tool. Grasp the tick as close to its head as possible. Avoid grasping the tick on its body. 3. Pull gently upward until the tick lets go. Do not twist the tick or jerk it suddenly. This may break off the tick's head or mouth parts. 4. Do not squeeze or crush the tick's body. This could force disease-carrying fluids from the tick into your body. 5. After the tick is removed, wash the bite area and your hands with soap and water or alcohol. 6. Apply a small amount of antiseptic cream or ointment to the bite site. 7. Wash any tools that were used. Do not try to remove a tick by applying a hot match, petroleum jelly, or fingernail polish to the tick. These methods do  not work. They may also increase the chances of disease being spread from the tick bite. WHEN SHOULD YOU SEEK HELP? Contact your health care provider if you are unable to remove a tick or if a part of the tick breaks off in the skin. After a tick bite, you need to watch for signs and symptoms of diseases that can be spread by ticks. Contact your health care provider if you develop any of the following:  Fever.  Rash.  Redness and puffiness (swelling) in the area of the tick bite.  Tender, puffy lymph glands.  Watery poop (diarrhea).  Weight loss.  Cough.  Feeling more tired than normal (fatigue).  Muscle, joint, or bone pain.  Belly (abdominal) pain.  Headache.  Change in your level of consciousness.  Trouble walking or moving your legs.  Loss of feeling (numbness) in the legs.  Loss of movement (paralysis).  Shortness of breath.  Confusion.  Throwing up (vomiting) many times. Document Released: 03/10/2010 Document Revised: 08/16/2013 Document Reviewed: 05/24/2013 ExitCare Patient Information 2015 [employer]. This information is not intended to replace advice given to you by your health care provider. Make sure you discuss any questions you have with your health care provider.  

## 2015-06-18 NOTE — Plan of Care (Signed)
Problem: Discharge Progression Outcomes Goal: Discharge plan in place and appropriate Individualization: Outcome: Progressing Pt prefers to be called Danielle Franklin Pt lives at home with spouse and son.      Pt has hx of hypothyroidism and HTN, controlled by home meds. Pt is a moderate fall risk. Able to call for assistance if needed. Goal: Other Discharge Outcomes/Goals Outcome: Progressing Plan of care progress to goal: Hemodynamically stable - vitals stable this shift Complications - controlled Activity - generalized weakness, moderate falls risk

## 2015-08-06 ENCOUNTER — Other Ambulatory Visit: Payer: Self-pay | Admitting: Internal Medicine

## 2015-08-06 DIAGNOSIS — R928 Other abnormal and inconclusive findings on diagnostic imaging of breast: Secondary | ICD-10-CM

## 2015-08-14 ENCOUNTER — Ambulatory Visit: Payer: No Typology Code available for payment source

## 2015-08-14 ENCOUNTER — Other Ambulatory Visit: Payer: No Typology Code available for payment source

## 2015-08-28 ENCOUNTER — Other Ambulatory Visit: Payer: Self-pay | Admitting: Internal Medicine

## 2015-08-28 ENCOUNTER — Ambulatory Visit
Admission: RE | Admit: 2015-08-28 | Discharge: 2015-08-28 | Disposition: A | Discharge status: Home / Self Care | Payer: No Typology Code available for payment source | Source: Ambulatory Visit | Attending: Internal Medicine | Admitting: Internal Medicine

## 2015-08-28 DIAGNOSIS — R928 Other abnormal and inconclusive findings on diagnostic imaging of breast: Secondary | ICD-10-CM | POA: Diagnosis not present

## 2016-07-28 IMAGING — CR DG CHEST 2V
2 series · 2 of 2 positions shown · non-contrast
Comparison: None.

CLINICAL DATA: 53-year-old female with fever and lethargy. Possible
recent tick bite. Initial encounter.

EXAM:
CHEST  2 VIEW

[chest pa]
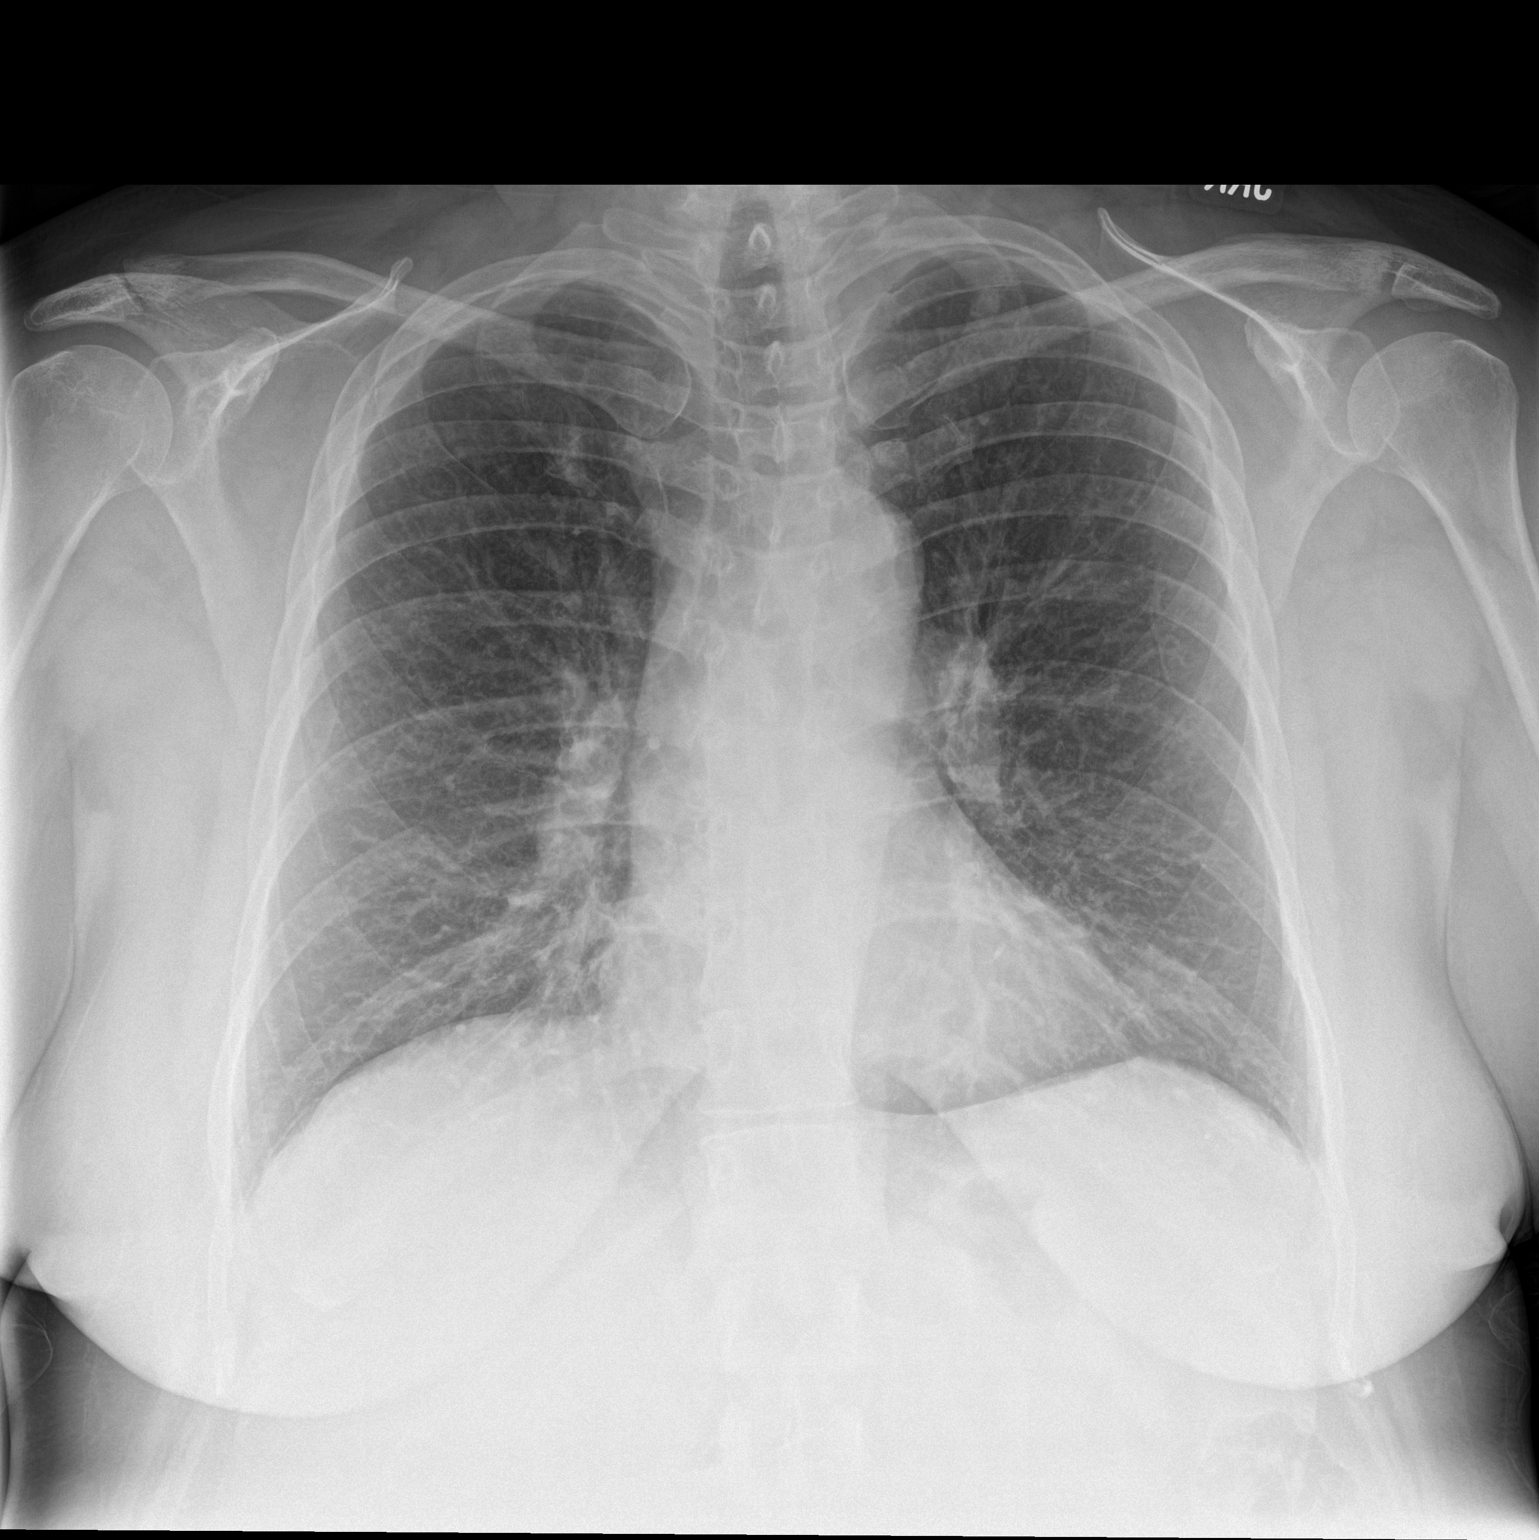

[chest lat]
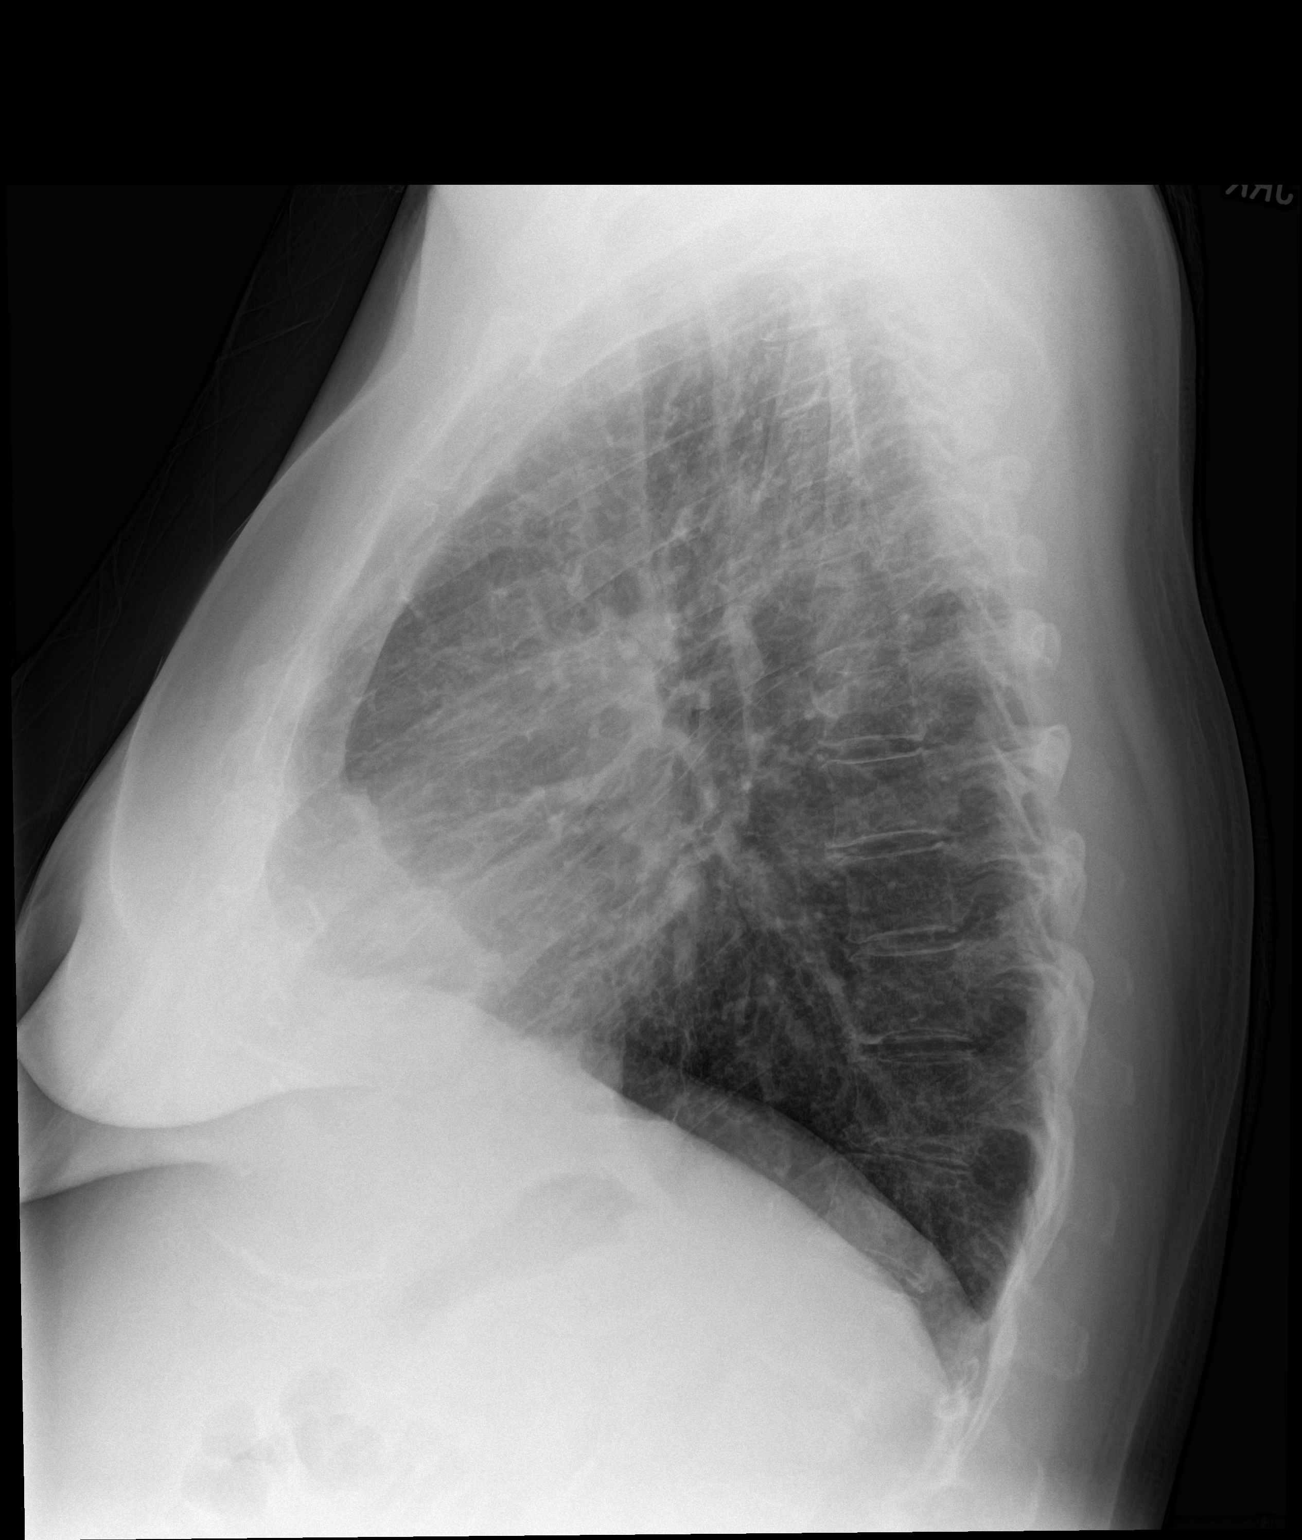

[2 of 2 positions shown; findings below may reference images not displayed]

FINDINGS: Lung volumes are within normal limits. Normal cardiac size and
mediastinal contours. Visualized tracheal air column is within
normal limits. No pneumothorax or pulmonary edema. No pleural
effusion or consolidation. No acute osseous abnormality identified.
Mild scoliosis and exaggeration of lower thoracic kyphosis.
IMPRESSION: No acute cardiopulmonary abnormality.

## 2016-08-25 ENCOUNTER — Other Ambulatory Visit: Payer: Self-pay | Admitting: Internal Medicine

## 2016-08-25 DIAGNOSIS — Z1231 Encounter for screening mammogram for malignant neoplasm of breast: Secondary | ICD-10-CM

## 2016-09-11 ENCOUNTER — Ambulatory Visit
Admission: RE | Admit: 2016-09-11 | Discharge: 2016-09-11 | Disposition: A | Discharge status: Home / Self Care | Payer: BLUE CROSS/BLUE SHIELD | Source: Ambulatory Visit | Attending: Internal Medicine | Admitting: Internal Medicine

## 2016-09-11 DIAGNOSIS — Z1231 Encounter for screening mammogram for malignant neoplasm of breast: Secondary | ICD-10-CM | POA: Diagnosis present

## 2016-10-10 IMAGING — MG MM DIAG BREAST TOMO UNI LEFT
5 series · 6 of 13 positions shown · non-contrast
Comparison: Previous exam(s).

CLINICAL DATA: Patient had a benign left ultrasound-guided core
biopsy 02/14/2015. Short-term interval followup.

EXAM:
DIGITAL DIAGNOSTIC LEFT MAMMOGRAM WITH 3D TOMOSYNTHESIS AND CAD

[L MLO synth-2D]
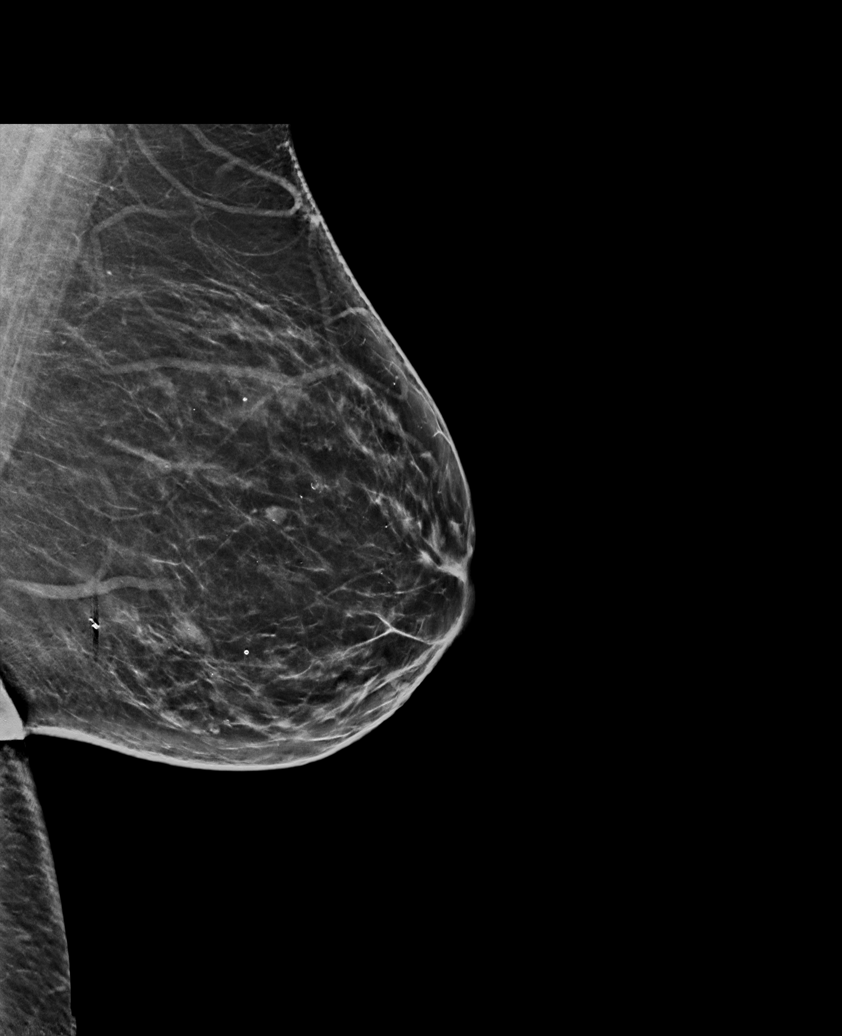

[L CC synth-2D]
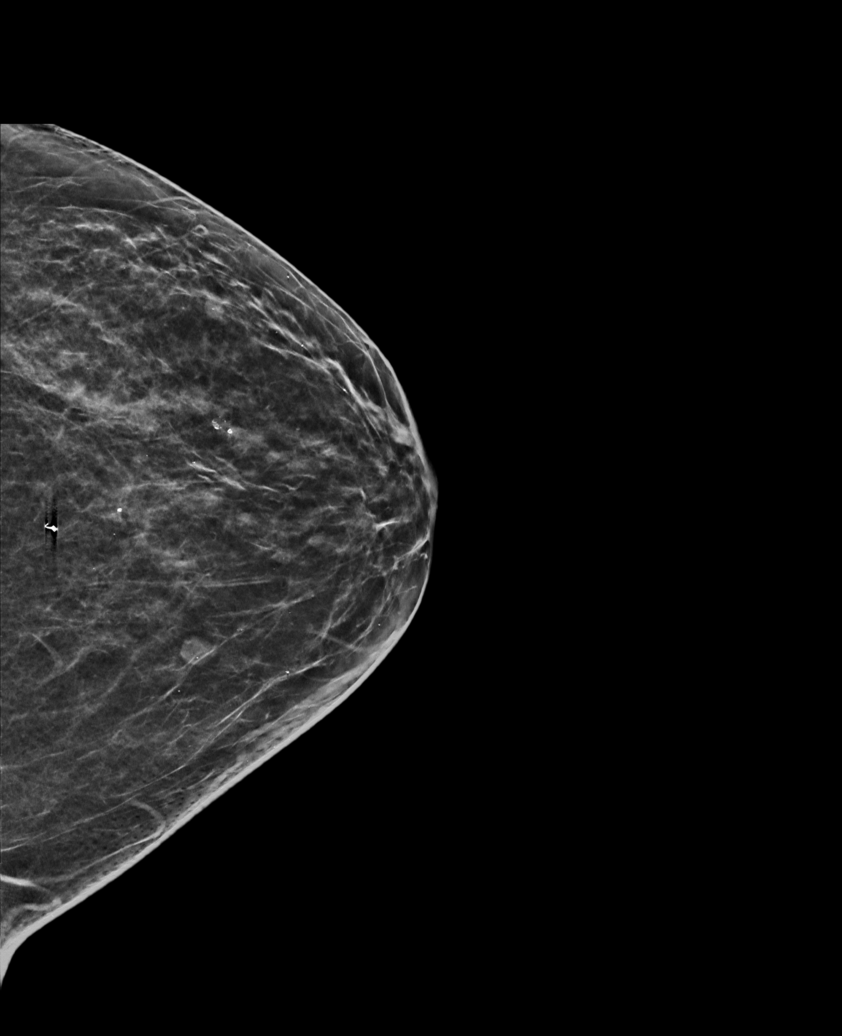

[L MLO]
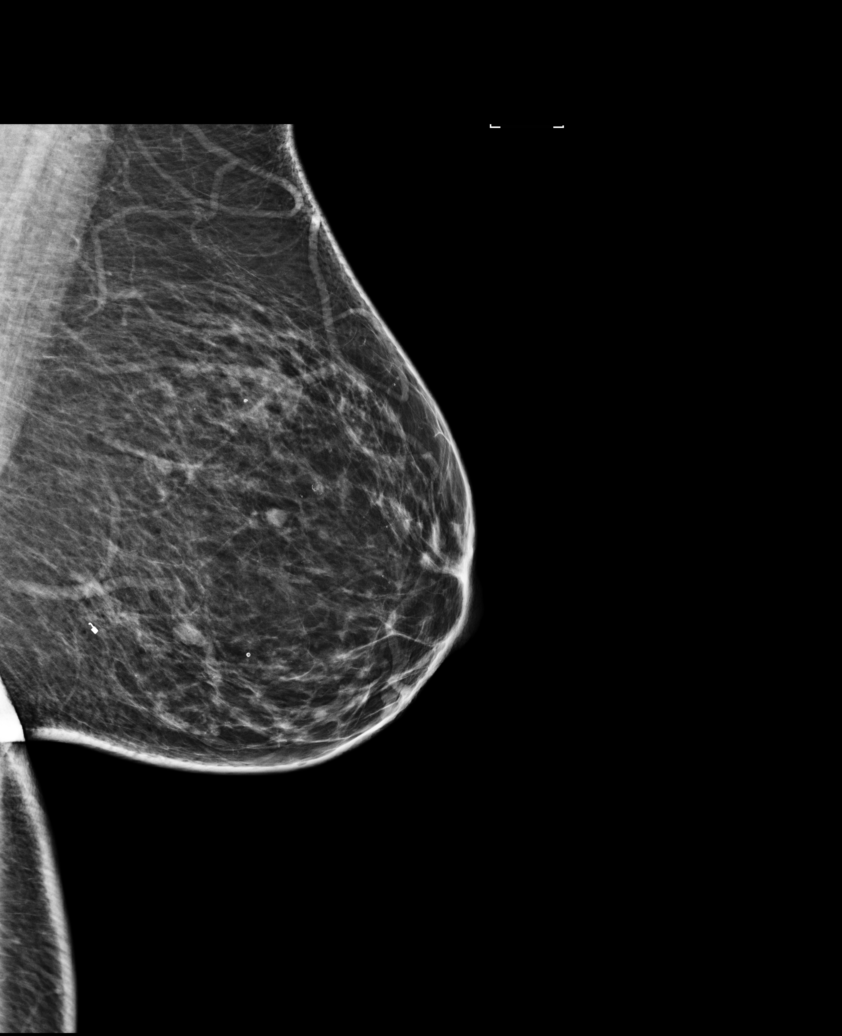

[L MLO tomo · 2 of 72 frames shown]
[frame 24/72]
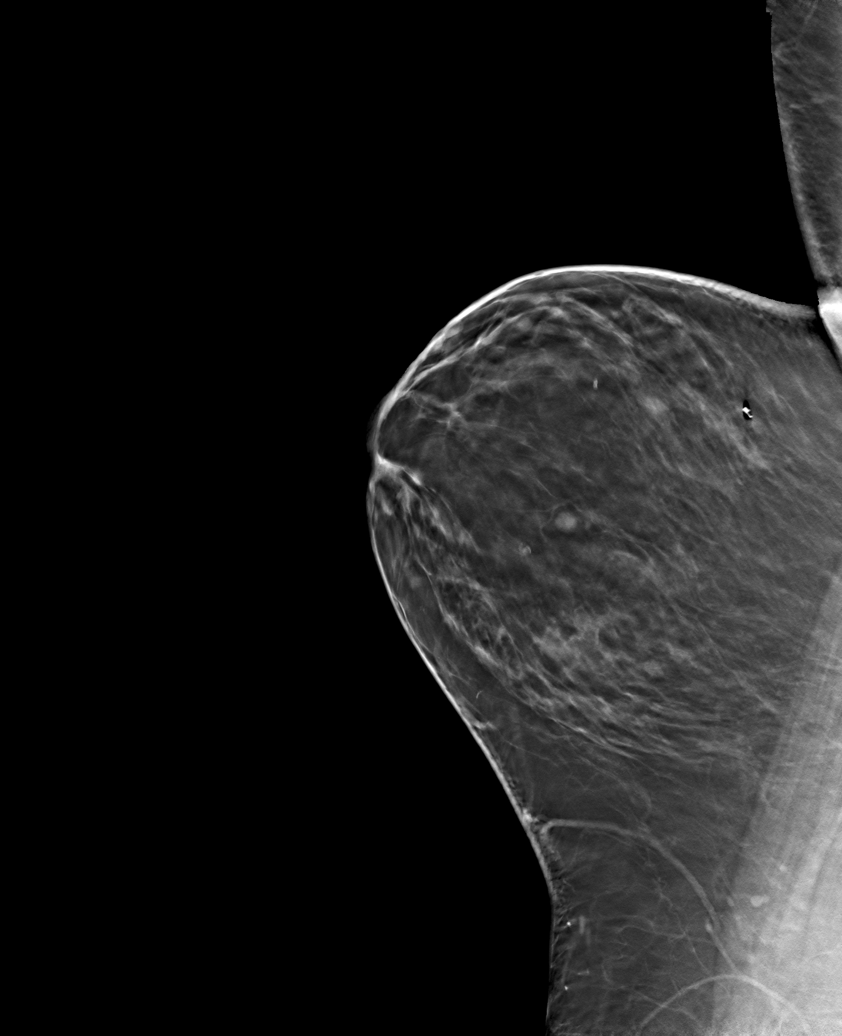
[frame 37/72]
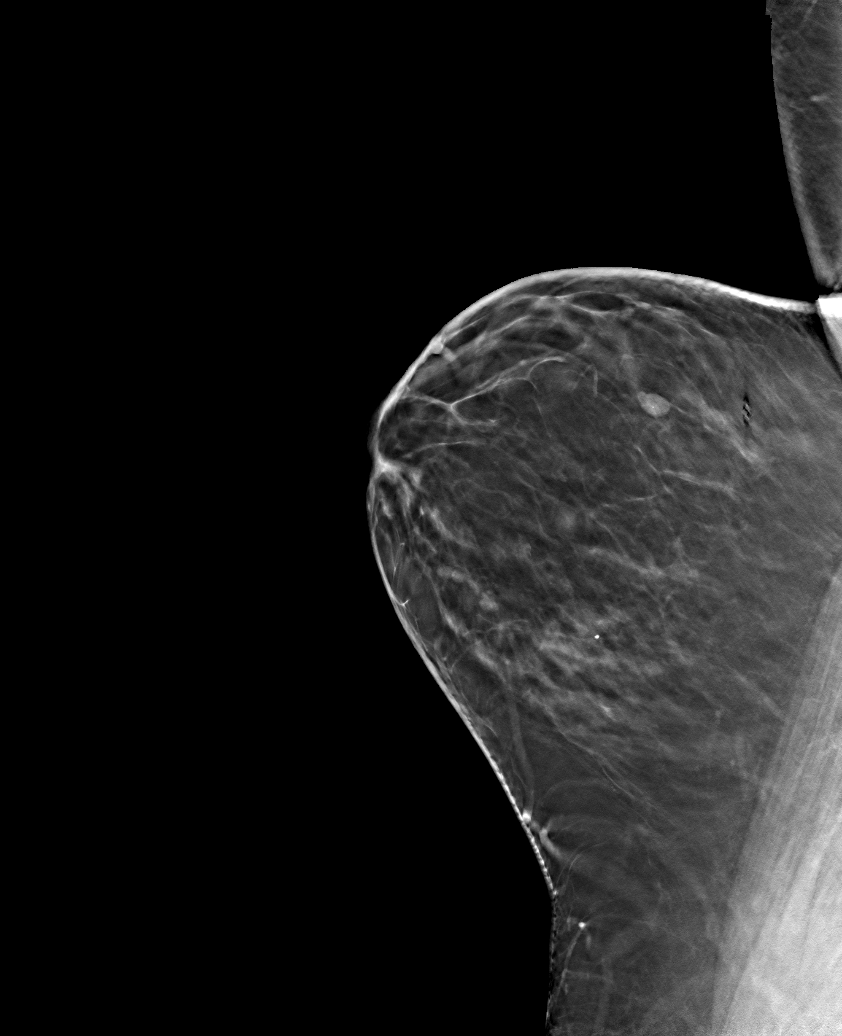

[L CC tomo · tomo slice 29/57.0]
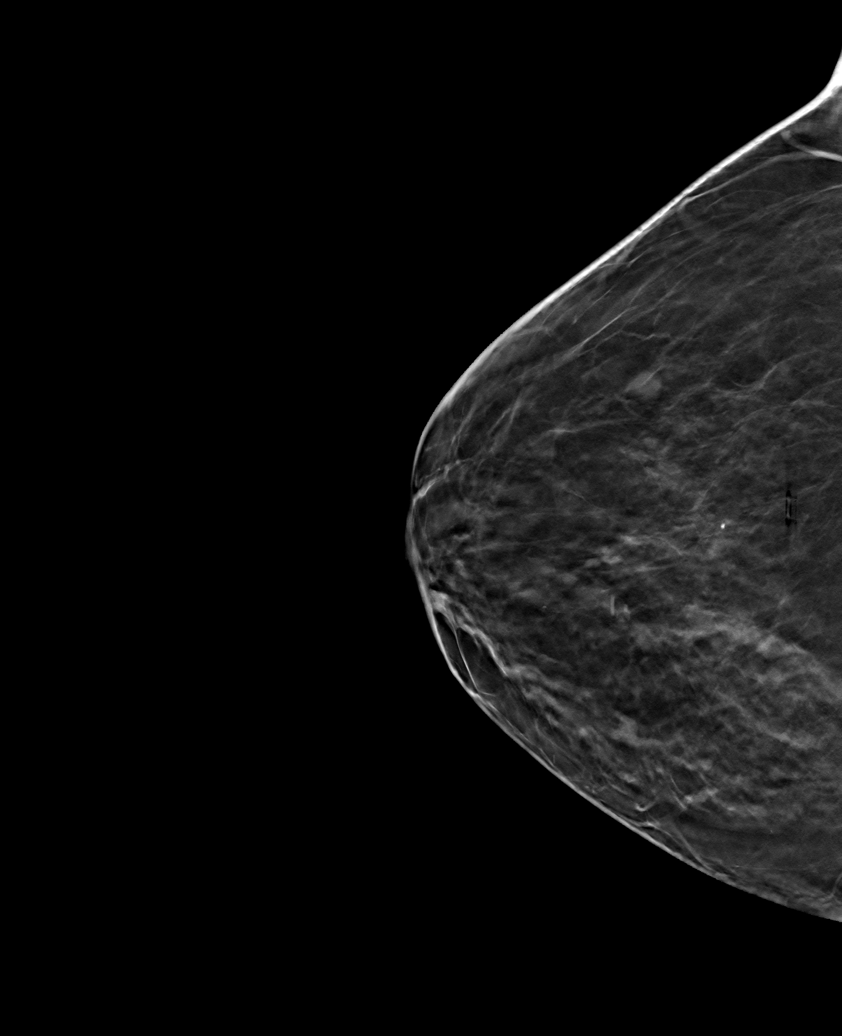

[6 of 13 positions shown; findings below may reference images not displayed]

ACR Breast Density Category c: The breast tissue is heterogeneously
dense, which may obscure small masses.
FINDINGS: Coil shaped clip is seen in the inferior aspect of the left breast.
No suspicious mass, malignant type microcalcifications or distortion
seen in the left breast.

Mammographic images were processed with CAD.
IMPRESSION: No evidence of malignancy in the left breast.

RECOMMENDATION:
Bilateral screening mammogram in Friday January, 2016 is recommended.

I have discussed the findings and recommendations with the patient.
Results were also provided in writing at the conclusion of the
visit. If applicable, a reminder letter will be sent to the patient
regarding the next appointment.

BI-RADS CATEGORY  2: Benign.

## 2017-08-31 ENCOUNTER — Other Ambulatory Visit: Payer: Self-pay | Admitting: Internal Medicine

## 2017-08-31 DIAGNOSIS — Z1231 Encounter for screening mammogram for malignant neoplasm of breast: Secondary | ICD-10-CM

## 2017-09-27 ENCOUNTER — Ambulatory Visit
Admission: RE | Admit: 2017-09-27 | Discharge: 2017-09-27 | Disposition: A | Discharge status: Home / Self Care | Payer: BLUE CROSS/BLUE SHIELD | Source: Ambulatory Visit | Attending: Internal Medicine | Admitting: Internal Medicine

## 2017-09-27 DIAGNOSIS — Z1231 Encounter for screening mammogram for malignant neoplasm of breast: Secondary | ICD-10-CM | POA: Diagnosis present

## 2018-09-27 ENCOUNTER — Other Ambulatory Visit: Payer: Self-pay | Admitting: Internal Medicine

## 2018-09-27 DIAGNOSIS — Z1231 Encounter for screening mammogram for malignant neoplasm of breast: Secondary | ICD-10-CM

## 2018-10-11 ENCOUNTER — Ambulatory Visit
Admission: RE | Admit: 2018-10-11 | Discharge: 2018-10-11 | Disposition: A | Discharge status: Home / Self Care | Payer: BLUE CROSS/BLUE SHIELD | Source: Ambulatory Visit | Attending: Internal Medicine | Admitting: Internal Medicine

## 2018-10-11 DIAGNOSIS — Z1231 Encounter for screening mammogram for malignant neoplasm of breast: Secondary | ICD-10-CM

## 2019-08-14 ENCOUNTER — Other Ambulatory Visit: Payer: Self-pay | Admitting: Internal Medicine

## 2019-08-14 DIAGNOSIS — Z1231 Encounter for screening mammogram for malignant neoplasm of breast: Secondary | ICD-10-CM

## 2019-10-16 ENCOUNTER — Ambulatory Visit
Admission: RE | Admit: 2019-10-16 | Discharge: 2019-10-16 | Disposition: A | Discharge status: Home / Self Care | Payer: BLUE CROSS/BLUE SHIELD | Source: Ambulatory Visit | Attending: Internal Medicine | Admitting: Internal Medicine

## 2019-10-16 DIAGNOSIS — Z1231 Encounter for screening mammogram for malignant neoplasm of breast: Secondary | ICD-10-CM | POA: Diagnosis present

## 2020-04-19 ENCOUNTER — Institutional Professional Consult (permissible substitution): Payer: No Typology Code available for payment source | Admitting: Pulmonary Disease

## 2020-09-16 ENCOUNTER — Other Ambulatory Visit: Payer: Self-pay | Admitting: Internal Medicine

## 2020-09-16 DIAGNOSIS — Z1231 Encounter for screening mammogram for malignant neoplasm of breast: Secondary | ICD-10-CM

## 2020-10-16 ENCOUNTER — Ambulatory Visit
Admission: RE | Admit: 2020-10-16 | Discharge: 2020-10-16 | Disposition: A | Discharge status: Home / Self Care | Payer: 59 | Source: Ambulatory Visit | Attending: Internal Medicine | Admitting: Internal Medicine

## 2020-10-16 ENCOUNTER — Other Ambulatory Visit: Payer: Self-pay

## 2020-10-16 DIAGNOSIS — Z1231 Encounter for screening mammogram for malignant neoplasm of breast: Secondary | ICD-10-CM

## 2021-05-05 ENCOUNTER — Encounter: Payer: Self-pay | Admitting: Emergency Medicine

## 2021-05-05 ENCOUNTER — Inpatient Hospital Stay
Admission: EM | Admit: 2021-05-05 | Discharge: 2021-05-09 | DRG: 871 | Disposition: A | Discharge status: Home / Self Care | Payer: 59 | Attending: Internal Medicine | Admitting: Internal Medicine

## 2021-05-05 ENCOUNTER — Emergency Department: Payer: 59

## 2021-05-05 ENCOUNTER — Inpatient Hospital Stay: Payer: 59

## 2021-05-05 DIAGNOSIS — E669 Obesity, unspecified: Secondary | ICD-10-CM | POA: Diagnosis present

## 2021-05-05 DIAGNOSIS — Z20822 Contact with and (suspected) exposure to covid-19: Secondary | ICD-10-CM | POA: Diagnosis present

## 2021-05-05 DIAGNOSIS — R739 Hyperglycemia, unspecified: Secondary | ICD-10-CM

## 2021-05-05 DIAGNOSIS — F17210 Nicotine dependence, cigarettes, uncomplicated: Secondary | ICD-10-CM | POA: Diagnosis present

## 2021-05-05 DIAGNOSIS — E039 Hypothyroidism, unspecified: Secondary | ICD-10-CM | POA: Diagnosis present

## 2021-05-05 DIAGNOSIS — E119 Type 2 diabetes mellitus without complications: Secondary | ICD-10-CM | POA: Diagnosis present

## 2021-05-05 DIAGNOSIS — E876 Hypokalemia: Secondary | ICD-10-CM | POA: Diagnosis present

## 2021-05-05 DIAGNOSIS — R945 Abnormal results of liver function studies: Secondary | ICD-10-CM | POA: Diagnosis present

## 2021-05-05 DIAGNOSIS — G8929 Other chronic pain: Secondary | ICD-10-CM | POA: Diagnosis present

## 2021-05-05 DIAGNOSIS — K810 Acute cholecystitis: Secondary | ICD-10-CM | POA: Diagnosis present

## 2021-05-05 DIAGNOSIS — K8000 Calculus of gallbladder with acute cholecystitis without obstruction: Secondary | ICD-10-CM | POA: Diagnosis present

## 2021-05-05 DIAGNOSIS — K819 Cholecystitis, unspecified: Secondary | ICD-10-CM | POA: Diagnosis not present

## 2021-05-05 DIAGNOSIS — R7989 Other specified abnormal findings of blood chemistry: Secondary | ICD-10-CM | POA: Diagnosis present

## 2021-05-05 DIAGNOSIS — Z7984 Long term (current) use of oral hypoglycemic drugs: Secondary | ICD-10-CM

## 2021-05-05 DIAGNOSIS — I10 Essential (primary) hypertension: Secondary | ICD-10-CM | POA: Diagnosis present

## 2021-05-05 DIAGNOSIS — Z79890 Hormone replacement therapy (postmenopausal): Secondary | ICD-10-CM

## 2021-05-05 DIAGNOSIS — A419 Sepsis, unspecified organism: Secondary | ICD-10-CM | POA: Diagnosis present

## 2021-05-05 DIAGNOSIS — Z6841 Body Mass Index (BMI) 40.0 and over, adult: Secondary | ICD-10-CM

## 2021-05-05 DIAGNOSIS — Z79899 Other long term (current) drug therapy: Secondary | ICD-10-CM | POA: Diagnosis not present

## 2021-05-05 DIAGNOSIS — R590 Localized enlarged lymph nodes: Secondary | ICD-10-CM

## 2021-05-05 DIAGNOSIS — R1084 Generalized abdominal pain: Secondary | ICD-10-CM

## 2021-05-05 DIAGNOSIS — R579 Shock, unspecified: Secondary | ICD-10-CM

## 2021-05-05 DIAGNOSIS — E785 Hyperlipidemia, unspecified: Secondary | ICD-10-CM | POA: Diagnosis present

## 2021-05-05 DIAGNOSIS — R6521 Severe sepsis with septic shock: Secondary | ICD-10-CM | POA: Diagnosis present

## 2021-05-05 DIAGNOSIS — Z720 Tobacco use: Secondary | ICD-10-CM | POA: Diagnosis present

## 2021-05-05 HISTORY — DX: Type 2 diabetes mellitus without complications: E11.9

## 2021-05-05 LAB — CBC WITH DIFFERENTIAL/PLATELET
Abs Immature Granulocytes: 0.11 10*3/uL — ABNORMAL HIGH (ref 0.00–0.07)
Basophils Absolute: 0.1 10*3/uL (ref 0.0–0.1)
Basophils Relative: 1 %
Eosinophils Absolute: 0.1 10*3/uL (ref 0.0–0.5)
Eosinophils Relative: 1 %
HCT: 44.8 % (ref 36.0–46.0)
Hemoglobin: 15.1 g/dL — ABNORMAL HIGH (ref 12.0–15.0)
Immature Granulocytes: 1 %
Lymphocytes Relative: 4 %
Lymphs Abs: 0.5 10*3/uL — ABNORMAL LOW (ref 0.7–4.0)
MCH: 30.9 pg (ref 26.0–34.0)
MCHC: 33.7 g/dL (ref 30.0–36.0)
MCV: 91.6 fL (ref 80.0–100.0)
Monocytes Absolute: 0.7 10*3/uL (ref 0.1–1.0)
Monocytes Relative: 6 %
Neutro Abs: 11.5 10*3/uL — ABNORMAL HIGH (ref 1.7–7.7)
Neutrophils Relative %: 87 %
Platelets: 142 10*3/uL — ABNORMAL LOW (ref 150–400)
RBC: 4.89 MIL/uL (ref 3.87–5.11)
RDW: 13.9 % (ref 11.5–15.5)
WBC: 13 10*3/uL — ABNORMAL HIGH (ref 4.0–10.5)
nRBC: 0 % (ref 0.0–0.2)

## 2021-05-05 LAB — CBG MONITORING, ED: Glucose-Capillary: 138 mg/dL — ABNORMAL HIGH (ref 70–99)

## 2021-05-05 LAB — BRAIN NATRIURETIC PEPTIDE: B Natriuretic Peptide: 99.2 pg/mL (ref 0.0–100.0)

## 2021-05-05 LAB — TROPONIN I (HIGH SENSITIVITY)
Troponin I (High Sensitivity): 9 ng/L (ref <18)
Troponin I (High Sensitivity): 7 ng/L (ref <18)

## 2021-05-05 LAB — COMPREHENSIVE METABOLIC PANEL
ALT: 145 U/L — ABNORMAL HIGH (ref 0–44)
AST: 277 U/L — ABNORMAL HIGH (ref 15–41)
Albumin: 3.5 g/dL (ref 3.5–5.0)
Alkaline Phosphatase: 72 U/L (ref 38–126)
Anion gap: 15 (ref 5–15)
BUN: 19 mg/dL (ref 6–20)
CO2: 25 mmol/L (ref 22–32)
Calcium: 8.7 mg/dL — ABNORMAL LOW (ref 8.9–10.3)
Chloride: 94 mmol/L — ABNORMAL LOW (ref 98–111)
Creatinine, Ser: 0.9 mg/dL (ref 0.44–1.00)
GFR, Estimated: >60 mL/min (ref >60)
Glucose, Bld: 202 mg/dL — ABNORMAL HIGH (ref 70–99)
Potassium: 2.9 mmol/L — ABNORMAL LOW (ref 3.5–5.1)
Sodium: 134 mmol/L — ABNORMAL LOW (ref 135–145)
Total Bilirubin: 4.3 mg/dL — ABNORMAL HIGH (ref 0.3–1.2)
Total Protein: 7 g/dL (ref 6.5–8.1)

## 2021-05-05 LAB — URINALYSIS, COMPLETE (UACMP) WITH MICROSCOPIC
Bacteria, UA: NONE SEEN
Glucose, UA: NEGATIVE mg/dL
Hgb urine dipstick: NEGATIVE
Ketones, ur: NEGATIVE mg/dL
Leukocytes,Ua: NEGATIVE
Nitrite: NEGATIVE
Protein, ur: 30 mg/dL — AB
Specific Gravity, Urine: 1.023 (ref 1.005–1.030)
pH: 5 (ref 5.0–8.0)

## 2021-05-05 LAB — D-DIMER, QUANTITATIVE: D-Dimer, Quant: 1.53 ug/mL-FEU — ABNORMAL HIGH (ref 0.00–0.50)

## 2021-05-05 LAB — PHOSPHORUS: Phosphorus: 3.6 mg/dL (ref 2.5–4.6)

## 2021-05-05 LAB — APTT: aPTT: 31 seconds (ref 24–36)

## 2021-05-05 LAB — HIV ANTIBODY (ROUTINE TESTING W REFLEX): HIV Screen 4th Generation wRfx: NONREACTIVE

## 2021-05-05 LAB — PROTIME-INR
INR: 1.1 (ref 0.8–1.2)
Prothrombin Time: 14.4 seconds (ref 11.4–15.2)

## 2021-05-05 LAB — RESP PANEL BY RT-PCR (FLU A&B, COVID) ARPGX2
Influenza A by PCR: NEGATIVE
Influenza B by PCR: NEGATIVE
SARS Coronavirus 2 by RT PCR: NEGATIVE

## 2021-05-05 LAB — PROCALCITONIN: Procalcitonin: 14.52 ng/mL

## 2021-05-05 LAB — LACTIC ACID, PLASMA
Lactic Acid, Venous: 4.7 mmol/L (ref 0.5–1.9)
Lactic Acid, Venous: 4.1 mmol/L (ref 0.5–1.9)
Lactic Acid, Venous: 2 mmol/L (ref 0.5–1.9)

## 2021-05-05 LAB — GLUCOSE, CAPILLARY: Glucose-Capillary: 115 mg/dL — ABNORMAL HIGH (ref 70–99)

## 2021-05-05 LAB — CK: Total CK: 62 U/L (ref 38–234)

## 2021-05-05 LAB — MAGNESIUM: Magnesium: 1.5 mg/dL — ABNORMAL LOW (ref 1.7–2.4)

## 2021-05-05 LAB — LIPASE, BLOOD: Lipase: 243 U/L — ABNORMAL HIGH (ref 11–51)

## 2021-05-05 MED ORDER — ACETAMINOPHEN 650 MG RE SUPP: 650.0000 mg | Freq: Four times a day (QID) | RECTAL | Status: DC | PRN

## 2021-05-05 MED ORDER — POTASSIUM CHLORIDE 10 MEQ/100ML IV SOLN
10.0000 meq | INTRAVENOUS | Status: DC
  Administered 2021-05-05 (×2): 10 meq via INTRAVENOUS
  Filled 2021-05-05 (×2): qty 100

## 2021-05-05 MED ORDER — POTASSIUM CHLORIDE 10 MEQ/100ML IV SOLN: 10.0000 meq | INTRAVENOUS | Status: DC

## 2021-05-05 MED ORDER — ONDANSETRON HCL 4 MG/2ML IJ SOLN
4.0000 mg | Freq: Three times a day (TID) | INTRAMUSCULAR | Status: DC | PRN
  Administered 2021-05-06: 4 mg via INTRAVENOUS
  Filled 2021-05-05: qty 2

## 2021-05-05 MED ORDER — INSULIN ASPART 100 UNIT/ML IJ SOLN: 0.0000 [IU] | Freq: Every day | INTRAMUSCULAR | Status: DC

## 2021-05-05 MED ORDER — DM-GUAIFENESIN ER 30-600 MG PO TB12: 1.0000 | ORAL_TABLET | Freq: Two times a day (BID) | ORAL | Status: DC | PRN

## 2021-05-05 MED ORDER — ALBUTEROL SULFATE HFA 108 (90 BASE) MCG/ACT IN AERS
2.0000 | INHALATION_SPRAY | RESPIRATORY_TRACT | Status: DC | PRN
  Filled 2021-05-05 (×2): qty 6.7

## 2021-05-05 MED ORDER — LACTATED RINGERS IV SOLN: INTRAVENOUS | Status: DC

## 2021-05-05 MED ORDER — POTASSIUM CHLORIDE 10 MEQ/100ML IV SOLN
10.0000 meq | INTRAVENOUS | Status: AC
  Administered 2021-05-05 (×2): 10 meq via INTRAVENOUS
  Filled 2021-05-05 (×2): qty 100

## 2021-05-05 MED ORDER — INSULIN ASPART 100 UNIT/ML IJ SOLN
0.0000 [IU] | Freq: Three times a day (TID) | INTRAMUSCULAR | Status: DC
  Administered 2021-05-08 (×2): 1 [IU] via SUBCUTANEOUS
  Filled 2021-05-05 (×2): qty 1

## 2021-05-05 MED ORDER — ACETAMINOPHEN 325 MG PO TABS: 650.0000 mg | ORAL_TABLET | Freq: Four times a day (QID) | ORAL | Status: DC | PRN

## 2021-05-05 MED ORDER — PIPERACILLIN-TAZOBACTAM 3.375 G IVPB
3.3750 g | Freq: Three times a day (TID) | INTRAVENOUS | Status: DC
  Administered 2021-05-05 – 2021-05-08 (×9): 3.375 g via INTRAVENOUS
  Filled 2021-05-05 (×9): qty 50

## 2021-05-05 MED ORDER — INSULIN ASPART 100 UNIT/ML IJ SOLN: 0.0000 [IU] | Freq: Three times a day (TID) | INTRAMUSCULAR | Status: DC

## 2021-05-05 MED ORDER — SODIUM CHLORIDE 0.9 % IV SOLN
1.0000 g | Freq: Once | INTRAVENOUS | Status: AC
  Administered 2021-05-05: 1 g via INTRAVENOUS
  Filled 2021-05-05: qty 10

## 2021-05-05 MED ORDER — LACTATED RINGERS IV BOLUS
1000.0000 mL | Freq: Once | INTRAVENOUS | Status: AC
  Administered 2021-05-05: 1000 mL via INTRAVENOUS

## 2021-05-05 MED ORDER — MORPHINE SULFATE (PF) 2 MG/ML IV SOLN
2.0000 mg | INTRAVENOUS | Status: DC | PRN
  Administered 2021-05-06 – 2021-05-08 (×4): 2 mg via INTRAVENOUS
  Filled 2021-05-05 (×4): qty 1

## 2021-05-05 MED ORDER — METRONIDAZOLE 500 MG/100ML IV SOLN
500.0000 mg | Freq: Once | INTRAVENOUS | Status: AC
  Administered 2021-05-05: 500 mg via INTRAVENOUS
  Filled 2021-05-05: qty 100

## 2021-05-05 MED ORDER — ONDANSETRON HCL 4 MG/2ML IJ SOLN: 4.0000 mg | Freq: Three times a day (TID) | INTRAMUSCULAR | Status: DC | PRN

## 2021-05-05 MED ORDER — NICOTINE 21 MG/24HR TD PT24: 21.0000 mg | MEDICATED_PATCH | Freq: Every day | TRANSDERMAL | Status: DC

## 2021-05-05 MED ORDER — IBUPROFEN 400 MG PO TABS
200.0000 mg | ORAL_TABLET | Freq: Four times a day (QID) | ORAL | Status: DC | PRN
  Administered 2021-05-06: 200 mg via ORAL
  Filled 2021-05-05: qty 1

## 2021-05-05 MED ORDER — POTASSIUM CHLORIDE CRYS ER 20 MEQ PO TBCR
40.0000 meq | EXTENDED_RELEASE_TABLET | Freq: Once | ORAL | Status: AC
  Administered 2021-05-05: 40 meq via ORAL
  Filled 2021-05-05: qty 2

## 2021-05-05 MED ORDER — POTASSIUM CHLORIDE CRYS ER 20 MEQ PO TBCR: 40.0000 meq | EXTENDED_RELEASE_TABLET | ORAL | Status: DC

## 2021-05-05 MED ORDER — MORPHINE SULFATE (PF) 2 MG/ML IV SOLN: 2.0000 mg | INTRAVENOUS | Status: DC | PRN

## 2021-05-05 MED ORDER — NICOTINE 21 MG/24HR TD PT24
21.0000 mg | MEDICATED_PATCH | Freq: Every day | TRANSDERMAL | Status: DC
  Administered 2021-05-05 – 2021-05-09 (×5): 21 mg via TRANSDERMAL
  Filled 2021-05-05 (×5): qty 1

## 2021-05-05 MED ORDER — LACTATED RINGERS IV BOLUS: 1000.0000 mL | Freq: Once | INTRAVENOUS | Status: DC

## 2021-05-05 MED ORDER — LEVOTHYROXINE SODIUM 100 MCG/5ML IV SOLN
50.0000 ug | Freq: Every day | INTRAVENOUS | Status: DC
  Administered 2021-05-05 – 2021-05-06 (×2): 50 ug via INTRAVENOUS
  Filled 2021-05-05 (×2): qty 5

## 2021-05-05 MED ORDER — IOHEXOL 350 MG/ML SOLN
75.0000 mL | Freq: Once | INTRAVENOUS | Status: AC | PRN
  Administered 2021-05-05: 75 mL via INTRAVENOUS

## 2021-05-05 MED ORDER — ALBUTEROL SULFATE HFA 108 (90 BASE) MCG/ACT IN AERS: 2.0000 | INHALATION_SPRAY | RESPIRATORY_TRACT | Status: DC | PRN

## 2021-05-05 MED ORDER — PRAVASTATIN SODIUM 20 MG PO TABS
40.0000 mg | ORAL_TABLET | Freq: Every day | ORAL | Status: DC
  Administered 2021-05-05 – 2021-05-08 (×4): 40 mg via ORAL
  Filled 2021-05-05 (×4): qty 1

## 2021-05-05 MED ORDER — MAGNESIUM SULFATE 2 GM/50ML IV SOLN
2.0000 g | Freq: Once | INTRAVENOUS | Status: AC
  Administered 2021-05-05: 2 g via INTRAVENOUS
  Filled 2021-05-05: qty 50

## 2021-05-05 MED ORDER — FENTANYL CITRATE (PF) 100 MCG/2ML IJ SOLN
50.0000 ug | Freq: Once | INTRAMUSCULAR | Status: AC
Start: 2021-05-05 — End: 2021-05-05
  Administered 2021-05-05: 50 ug via INTRAVENOUS
  Filled 2021-05-05: qty 2

## 2021-05-05 NOTE — Progress Notes (Addendum)
CODE SEPSIS - PHARMACY COMMUNICATION  **Broad Spectrum Antibiotics should be administered within 1 hour of Sepsis diagnosis**  Time Code Sepsis Called/Page Received: 1302  Antibiotics Ordered: ceftriaxone  Time of 1st antibiotic administration: 1230  Additional action taken by pharmacy: none required  If necessary, Name of Provider/Nurse Contacted: N/A    Lowella Bandy ,PharmD Clinical Pharmacist  05/05/2021  1:12 PM

## 2021-05-05 NOTE — ED Triage Notes (Signed)
Patient arrives via [facility] for generalized weakness and lower back pain. Patient AOx4.

## 2021-05-05 NOTE — Progress Notes (Signed)
PT ADMITTED FOR SEPSIS    05/05/21 2200  Assess: MEWS Score  BP (!) 79/60  Pulse Rate 75  ECG Heart Rate 76  Resp (!) 21  Level of Consciousness Alert  SpO2 93 %  O2 Device Nasal Cannula  O2 Flow Rate (L/min) 1 L/min  Assess: MEWS Score  MEWS Temp 0  MEWS Systolic 2  MEWS Pulse 0  MEWS RR 1  MEWS LOC 0  MEWS Score 3  MEWS Score Color Yellow  Assess: if the MEWS score is Yellow or Red  Were vital signs taken at a resting state? Yes  Focused Assessment No change from prior assessment  Early Detection of Sepsis Score *See Row Information* High  MEWS guidelines implemented *See Row Information* No, other (Comment)  Treat  MEWS Interventions Escalated (See documentation below)  Pain Scale 0-10  Pain Score 0  Patients Stated Pain Goal 0  Take Vital Signs  Increase Vital Sign Frequency  Yellow: Q 2hr X 2 then Q 4hr X 2, if remains yellow, continue Q 4hrs (Pt is already Q2 VS)  Escalate  MEWS: Escalate Yellow: discuss with charge nurse/RN and consider discussing with provider and RRT  Notify: Charge Nurse/RN  Name of Charge Nurse/RN Notified Melissa RN  Date Charge Nurse/RN Notified 05/05/21  Time Charge Nurse/RN Notified 2328  Document  Patient Outcome Other (Comment) (No changes - pt admitted for sepsis)  Progress note created (see row info) Yes

## 2021-05-05 NOTE — H&P (Signed)
History and Physical    Danielle Franklin ZOX:[id number] DOB: [birth year] DOA: 05/05/2021  Referring MD/NP/PA:   PCP: Gracelyn Nurse, MD   Patient coming from:  The patient is coming from home.  At baseline, pt is independent for most of ADL.        Chief Complaint: Nausea, vomiting, weakness  HPI: Danielle Franklin is a 59 y.o. female with medical history significant of hypertension, hyperlipidemia, diabetes mellitus, hypothyroidism, tobacco abuse, tickborne fever, who presents with nausea, vomiting, weakness.  Patient states that she has not been feeling well for almost a week.  She has nausea and had a multiple times of nonbilious nonbloody vomiting.  No diarrhea, denies abdominal pain.  She she had chills, but no fever at home.  No symptoms of UTI.  Patient has a chronic mild shortness of breath and in mild withdrawal coughing which she attributes to smoking, no significant change.  She also reports chronic bilateral knee pain.  She has back pain.  ED Course: pt was found to have WBC 13.0, lactic acid of 4.7, 4.1, INR 1.01, PTT 31, troponin level 9, 7, negative COVID PCR, CK level 62, D-dimer +1.53, urinalysis with squamous cell contamination, potassium 2.9, renal function okay, abnormal liver function (ALP 72, AST 277, ALT 145, total bilirubin 4.3), temperature 99.3, softer blood pressure 92/69, tachycardia with heart rate 111, RR 23, oxygen saturation 94% on room air.  Chest x-ray showed lingular atelectasis.  CT angiogram of her chest is negative for PE, but showed mediastinal adenopathy.  CT abdomen/pelvis showed gallstones.  Patient is admitted to progressive bed as inpatient.  Dr. Lady Gary of surgery is consulted.  US-RUQ: 1. Cholelithiasis with gallbladder wall thickening. Concern for a degree of acute cholecystitis. This finding may warrant nuclear medicine hepatobiliary imaging study to assess for cystic duct patency.  2. Increased liver echogenicity, a finding indicative of  hepatic steatosis. No focal liver lesions evident by ultrasound. Note that the sensitivity of ultrasound for detection of focal liver lesions is diminished in this circumstance.   CTA of chest showed: 1. No evidence of pulmonary embolism or acute cardiopulmonary disease. 2. Mild mediastinal, right hilar and upper abdominal adenopathy as described. Findings are nonspecific and may be due to neoplastic process such as leukemia/lymphoma or metastatic disease versus an infectious or inflammatory process. Recommend clinical correlation for primary neoplasm. 3. Moderate cholelithiasis. 4. Aortic atherosclerosis. Atherosclerotic coronary artery disease. Mild cardiomegaly.  Aortic Atherosclerosis (ICD10-I70.0).    Review of Systems:   General: no fevers, has chills, no body weight gain, has poor appetite, has fatigue HEENT: no blurry vision, hearing changes or sore throat Respiratory: has dyspnea, coughing, no wheezing CV: no chest pain, no palpitations GI: has nausea, vomiting, no abdominal pain, diarrhea, constipation GU: no dysuria, burning on urination, increased urinary frequency, hematuria  Ext: no leg edema Neuro: no unilateral weakness, numbness, or tingling, no vision change or hearing loss Skin: no rash, no skin tear. MSK: No muscle spasm, no deformity, no limitation of range of movement in spin. Has back pain and knee pain Heme: No easy bruising.  Travel history: No recent long distant travel.  Allergy:  Allergies  Allergen Reactions  . Caduet [Amlodipine-Atorvastatin]     Past Medical History:  Diagnosis Date  . Hypertension   . Thyroid disease     Past Surgical History:  Procedure Laterality Date  . BREAST BIOPSY Left early 2000s   benign    Social History:  reports that she has  been smoking. She has been smoking about 1.00 pack per day. She does not have any smokeless tobacco history on file. She reports that she does not drink alcohol. No history on file  for drug use.  Family History:  Family History  Problem Relation Age of Onset  . Diabetes Other   . Diabetes Mother   . Breast cancer Mother [id number]  . Diabetes Maternal Grandmother   . Breast cancer Maternal Grandmother      Prior to Admission medications   Medication Sig Start Date End Date Taking? Authorizing Provider  ARMOUR THYROID 90 MG tablet Take 90 mg by mouth daily. 05/15/15  Yes [provider]  atenolol-chlorthalidone (TENORETIC) 50-25 MG tablet Take 1 tablet by mouth daily.   Yes [provider]  glimepiride (AMARYL) 1 MG tablet Take 1 mg by mouth daily. 04/09/21  Yes [provider]  lovastatin (MEVACOR) 40 MG tablet Take 1 tablet by mouth at bedtime. 05/15/15  Yes [provider]  potassium chloride (KLOR-CON) 10 MEQ tablet Take 10 mEq by mouth daily. 05/01/21  Yes [provider]  atenolol (TENORMIN) 100 MG tablet Take 1 tablet (100 mg total) by mouth daily. Patient not taking: Reported on 05/05/2021 06/18/15   Delfino LovettShah, Vipul, MD  doxycycline (VIBRA-TABS) 100 MG tablet Take 1 tablet (100 mg total) by mouth every 12 (twelve) hours. Patient not taking: No sig reported 06/18/15   Delfino LovettShah, Vipul, MD  ondansetron (ZOFRAN) 4 MG tablet Take 1 tablet (4 mg total) by mouth every 6 (six) hours as needed for nausea. Patient not taking: Reported on 05/05/2021 06/16/15   Katharina CaperVaickute, Rima, MD    Physical Exam: Vitals:   05/05/21 1300 05/05/21 1330 05/05/21 1400 05/05/21 1500  BP: 111/74 104/65 112/65 (!) 108/51  Pulse: (!) 103 (!) 104 94 95  Resp: (!) 23  18 18   Temp:      TempSrc:      SpO2: 97% 95% 95% 96%  Weight:      Height:       General: Not in acute distress HEENT:       Eyes: PERRL, EOMI, no scleral icterus.       ENT: No discharge from the ears and nose, no pharynx injection, no tonsillar enlargement.        Neck: No JVD, no bruit, no mass felt. Heme: No neck lymph node enlargement. Cardiac: S1/S2, RRR, No murmurs, No gallops or  rubs. Respiratory: No rales, wheezing, rhonchi or rubs. GI: Soft, nondistended, nontender, no rebound pain, no organomegaly, BS present. GU: No hematuria Ext: No pitting leg edema bilaterally. 1+DP/PT pulse bilaterally. Musculoskeletal: No joint deformities, No joint redness or warmth, no limitation of ROM in spin. Skin: No rashes.  Neuro: Alert, oriented X3, cranial nerves II-XII grossly intact, moves all extremities normally. Psych: Patient is not psychotic, no suicidal or hemocidal ideation.  Labs on Admission: I have personally reviewed following labs and imaging studies  CBC: Recent Labs  Lab 05/05/21 1113  WBC 13.0*  NEUTROABS 11.5*  HGB 15.1*  HCT 44.8  MCV 91.6  PLT 142*   Basic Metabolic Panel: Recent Labs  Lab 05/05/21 1113 05/05/21 1139  NA 134*  --   K 2.9*  --   CL 94*  --   CO2 25  --   GLUCOSE 202*  --   BUN 19  --   CREATININE 0.90  --   CALCIUM 8.7*  --   MG 1.5*  --   PHOS  --  3.6   GFR: Estimated Creatinine Clearance: 75.8 mL/min (by C-G formula based on SCr of 0.9 mg/dL). Liver Function Tests: Recent Labs  Lab 05/05/21 1113  AST 277*  ALT 145*  ALKPHOS 72  BILITOT 4.3*  PROT 7.0  ALBUMIN 3.5   Recent Labs  Lab 05/05/21 1423  LIPASE 243*   No results for input(s): AMMONIA in the last 168 hours. Coagulation Profile: Recent Labs  Lab 05/05/21 1113  INR 1.1   Cardiac Enzymes: Recent Labs  Lab 05/05/21 1113  CKTOTAL 62   BNP (last 3 results) No results for input(s): PROBNP in the last 8760 hours. HbA1C: No results for input(s): HGBA1C in the last 72 hours. CBG: No results for input(s): GLUCAP in the last 168 hours. Lipid Profile: No results for input(s): CHOL, HDL, LDLCALC, TRIG, CHOLHDL, LDLDIRECT in the last 72 hours. Thyroid Function Tests: No results for input(s): TSH, T4TOTAL, FREET4, T3FREE, THYROIDAB in the last 72 hours. Anemia Panel: No results for input(s): VITAMINB12, FOLATE, FERRITIN, TIBC, IRON, RETICCTPCT  in the last 72 hours. Urine analysis:    Component Value Date/Time   COLORURINE AMBER (A) 05/05/2021 1112   APPEARANCEUR CLOUDY (A) 05/05/2021 1112   LABSPEC 1.023 05/05/2021 1112   PHURINE 5.0 05/05/2021 1112   GLUCOSEU NEGATIVE 05/05/2021 1112   HGBUR NEGATIVE 05/05/2021 1112   BILIRUBINUR MODERATE (A) 05/05/2021 1112   KETONESUR NEGATIVE 05/05/2021 1112   PROTEINUR 30 (A) 05/05/2021 1112   NITRITE NEGATIVE 05/05/2021 1112   LEUKOCYTESUR NEGATIVE 05/05/2021 1112   Sepsis Labs: @LABRCNTIP (procalcitonin:4,lacticidven:4) ) Recent Results (from the past 240 hour(s))  Resp Panel by RT-PCR (Flu A&B, Covid) Nasopharyngeal Swab     Status: None   Collection Time: 05/05/21 11:13 AM   Specimen: Nasopharyngeal Swab; Nasopharyngeal(NP) swabs in vial transport medium  Result Value Ref Range Status   SARS Coronavirus 2 by RT PCR NEGATIVE NEGATIVE Final    Comment: (NOTE) SARS-CoV-2 target nucleic acids are NOT DETECTED.  The SARS-CoV-2 RNA is generally detectable in upper respiratory specimens during the acute phase of infection. The lowest concentration of SARS-CoV-2 viral copies this assay can detect is 138 copies/mL. A negative result does not preclude SARS-Cov-2 infection and should not be used as the sole basis for treatment or other patient management decisions. A negative result may occur with  improper specimen collection/handling, submission of specimen other than nasopharyngeal swab, presence of viral mutation(s) within the areas targeted by this assay, and inadequate number of viral copies(<138 copies/mL). A negative result must be combined with clinical observations, patient history, and epidemiological information. The expected result is Negative.  Fact Sheet for Patients:  07/05/21  Fact Sheet for Healthcare Providers:  BloggerCourse.com  This test is no t yet approved or cleared by the [place] FDA and   has been authorized for detection and/or diagnosis of SARS-CoV-2 by FDA under an Emergency Use Authorization (EUA). This EUA will remain  in effect (meaning this test can be used) for the duration of the COVID-19 declaration under Section 564(b)(1) of the Act, 21 U.S.C.section 360bbb-3(b)(1), unless the authorization is terminated  or revoked sooner.       Influenza A by PCR NEGATIVE NEGATIVE Final   Influenza B by PCR NEGATIVE NEGATIVE Final    Comment: (NOTE) The Xpert Xpress SARS-CoV-2/FLU/RSV plus assay is intended as an aid in the diagnosis of influenza from Nasopharyngeal swab specimens and should not be used as a sole basis for treatment. Nasal washings and aspirates are unacceptable for Xpert Xpress  SARS-CoV-2/FLU/RSV testing.  Fact Sheet for Patients: BloggerCourse.com  Fact Sheet for Healthcare Providers: Serious[employer]ker.it  This test is not yet approved or cleared by the [place] FDA and has been authorized for detection and/or diagnosis of SARS-CoV-2 by FDA under an Emergency Use Authorization (EUA). This EUA will remain in effect (meaning this test can be used) for the duration of the COVID-19 declaration under Section 564(b)(1) of the Act, 21 U.S.C. section 360bbb-3(b)(1), unless the authorization is terminated or revoked.  Performed at [facility], [address], [place], [place] [address]      Radiological Exams on Admission: CT Angio Chest PE W and/or Wo Contrast  Result Date: 05/05/2021 CLINICAL DATA:  Just weakness and low back pain. Suspect pulmonary embolism. Abdominal pain. EXAM: CT ANGIOGRAPHY CHEST CT ABDOMEN AND PELVIS WITH CONTRAST TECHNIQUE: Multidetector CT imaging of the chest was performed using the standard protocol during bolus administration of intravenous contrast. Multiplanar CT image reconstructions and MIPs were obtained to evaluate the vascular anatomy. Multidetector CT  imaging of the abdomen and pelvis was performed using the standard protocol during bolus administration of intravenous contrast. CONTRAST:  75mL OMNIPAQUE IOHEXOL 350 MG/ML SOLN COMPARISON:  None. FINDINGS: CTA CHEST FINDINGS Cardiovascular: Borderline cardiomegaly. Minimal calcified plaque over the left main and 3 vessel coronary arteries. Thoracic aorta is normal caliber. Minimal calcified plaque over the thoracic aorta. Pulmonary arterial system is well opacified without evidence of emboli. Mediastinum/Nodes: Increased number of mediastinal lymph nodes with a few mildly enlarged mediastinal lymph nodes present. There is a 1.3 cm AP window lymph node. 1.1 cm 1.2 cm precarinal lymph node. 1.6 cm subcarinal lymph node. No left hilar adenopathy. 1.1 cm right hilar lymph node. 1 cm lymph node anterior to the distal esophagus. Remaining mediastinal structures are unremarkable. Lungs/Pleura: Lungs are adequately inflated without evidence of effusion. Minimal linear density over the medial lingula and right middle lobe compatible with scarring/atelectasis. No focal airspace consolidation. Airways are normal. Musculoskeletal: Minimal degenerative change of the spine. Review of the MIP images confirms the above findings. CT ABDOMEN and PELVIS FINDINGS Hepatobiliary: Moderate cholelithiasis. Liver and biliary tree are normal. Pancreas: Normal. Spleen: Normal. Adrenals/Urinary Tract: Adrenal glands are normal. Kidneys are normal in size without hydronephrosis or nephrolithiasis. Ureters and bladder are normal. Stomach/Bowel: Stomach is normal. Prominent diverticulum over the convex surface of the duodenal C sweep. Remainder of small bowel is unremarkable. Appendix is not visualized. Colon is normal. Vascular/Lymphatic: Calcified plaque over the abdominal aorta which is normal in caliber. Mild adenopathy in the region of the celiac axis with 1.2 cm lymph node noted as well as over the porta hepatis. Mild adenopathy over the  gastrohepatic ligament with increased number of subcentimeter nodes over the retrocrural space and periaortic region. Reproductive: Normal. Other: No free fluid or focal inflammatory change. Musculoskeletal: Mild degenerative change of the spine. Review of the MIP images confirms the above findings. IMPRESSION: 1. No evidence of pulmonary embolism or acute cardiopulmonary disease. 2. Mild mediastinal, right hilar and upper abdominal adenopathy as described. Findings are nonspecific and may be due to neoplastic process such as leukemia/lymphoma or metastatic disease versus an infectious or inflammatory process. Recommend clinical correlation for primary neoplasm. 3. Moderate cholelithiasis. 4. Aortic atherosclerosis. Atherosclerotic coronary artery disease. Mild cardiomegaly. Aortic Atherosclerosis (ICD10-I70.0). Electronically Signed   By: Elberta Fortis M.D.   On: 05/05/2021 14:03   CT ABDOMEN PELVIS W CONTRAST  Result Date: 05/05/2021 CLINICAL DATA:  Just weakness and low back pain. Suspect pulmonary  embolism. Abdominal pain. EXAM: CT ANGIOGRAPHY CHEST CT ABDOMEN AND PELVIS WITH CONTRAST TECHNIQUE: Multidetector CT imaging of the chest was performed using the standard protocol during bolus administration of intravenous contrast. Multiplanar CT image reconstructions and MIPs were obtained to evaluate the vascular anatomy. Multidetector CT imaging of the abdomen and pelvis was performed using the standard protocol during bolus administration of intravenous contrast. CONTRAST:  54mL OMNIPAQUE IOHEXOL 350 MG/ML SOLN COMPARISON:  None. FINDINGS: CTA CHEST FINDINGS Cardiovascular: Borderline cardiomegaly. Minimal calcified plaque over the left main and 3 vessel coronary arteries. Thoracic aorta is normal caliber. Minimal calcified plaque over the thoracic aorta. Pulmonary arterial system is well opacified without evidence of emboli. Mediastinum/Nodes: Increased number of mediastinal lymph nodes with a few mildly  enlarged mediastinal lymph nodes present. There is a 1.3 cm AP window lymph node. 1.1 cm 1.2 cm precarinal lymph node. 1.6 cm subcarinal lymph node. No left hilar adenopathy. 1.1 cm right hilar lymph node. 1 cm lymph node anterior to the distal esophagus. Remaining mediastinal structures are unremarkable. Lungs/Pleura: Lungs are adequately inflated without evidence of effusion. Minimal linear density over the medial lingula and right middle lobe compatible with scarring/atelectasis. No focal airspace consolidation. Airways are normal. Musculoskeletal: Minimal degenerative change of the spine. Review of the MIP images confirms the above findings. CT ABDOMEN and PELVIS FINDINGS Hepatobiliary: Moderate cholelithiasis. Liver and biliary tree are normal. Pancreas: Normal. Spleen: Normal. Adrenals/Urinary Tract: Adrenal glands are normal. Kidneys are normal in size without hydronephrosis or nephrolithiasis. Ureters and bladder are normal. Stomach/Bowel: Stomach is normal. Prominent diverticulum over the convex surface of the duodenal C sweep. Remainder of small bowel is unremarkable. Appendix is not visualized. Colon is normal. Vascular/Lymphatic: Calcified plaque over the abdominal aorta which is normal in caliber. Mild adenopathy in the region of the celiac axis with 1.2 cm lymph node noted as well as over the porta hepatis. Mild adenopathy over the gastrohepatic ligament with increased number of subcentimeter nodes over the retrocrural space and periaortic region. Reproductive: Normal. Other: No free fluid or focal inflammatory change. Musculoskeletal: Mild degenerative change of the spine. Review of the MIP images confirms the above findings. IMPRESSION: 1. No evidence of pulmonary embolism or acute cardiopulmonary disease. 2. Mild mediastinal, right hilar and upper abdominal adenopathy as described. Findings are nonspecific and may be due to neoplastic process such as leukemia/lymphoma or metastatic disease versus an  infectious or inflammatory process. Recommend clinical correlation for primary neoplasm. 3. Moderate cholelithiasis. 4. Aortic atherosclerosis. Atherosclerotic coronary artery disease. Mild cardiomegaly. Aortic Atherosclerosis (ICD10-I70.0). Electronically Signed   By: Elberta Fortis M.D.   On: 05/05/2021 14:03   DG Chest Port 1 View  Result Date: 05/05/2021 CLINICAL DATA:  Questionable sepsis EXAM: PORTABLE CHEST 1 VIEW COMPARISON:  06/15/2015 FINDINGS: Lingular opacity, favor atelectasis although infiltrate cannot be excluded. No confluent opacity on the right. Heart is normal size. No effusions or acute bony abnormality. IMPRESSION: Lingular atelectasis or infiltrate. Electronically Signed   By: Charlett Nose M.D.   On: 05/05/2021 11:42   US ABDOMEN LIMITED RUQ (LIVER/GB)  Result Date: 05/05/2021 CLINICAL DATA:  Elevated liver enzymes EXAM: ULTRASOUND ABDOMEN LIMITED RIGHT UPPER QUADRANT COMPARISON:  CT abdomen and pelvis May 05, 2021 FINDINGS: Gallbladder: Within the gallbladder, there are echogenic foci which move and shadow consistent with cholelithiasis. Largest gallstone measures 1.1 cm. There is gallbladder wall thickening. No appreciable pericholecystic fluid. No sonographic Murphy sign noted by sonographer. Common bile duct: Diameter: 4 mm. No intrahepatic or extrahepatic biliary  duct dilatation. Liver: No focal lesion identified. Liver echogenicity is increased diffusely. Portal vein is patent on color Doppler imaging with normal direction of blood flow towards the liver. Other: None. IMPRESSION: 1. Cholelithiasis with gallbladder wall thickening. Concern for a degree of acute cholecystitis. This finding may warrant nuclear medicine hepatobiliary imaging study to assess for cystic duct patency. 2. Increased liver echogenicity, a finding indicative of hepatic steatosis. No focal liver lesions evident by ultrasound. Note that the sensitivity of ultrasound for detection of focal liver lesions is  diminished in this circumstance. Electronically Signed   By: Bretta Bang III M.D.   On: 05/05/2021 15:06     EKG: I have personally reviewed.  Sinus rhythm, QTC 461, low voltage, nonspecific T wave change  Assessment/Plan Principal Problem:   Acute cholecystitis Active Problems:   Hypothyroid   Hypokalemia   Severe sepsis with septic shock (HCC)   Tobacco abuse   Hypomagnesemia   Mediastinal adenopathy   Abnormal LFTs  Severe sepsis with septic shock due to acute gallstone cholecystitis: Patient meets criteria for severe sepsis with septic shock. has leukocytosis with WBC 13.0, tachycardia with heart rate of 111, tachypnea with RR 23, lactic acid is elevated up to 4.7 --> 4.1.  Currently blood pressure is soft, and hemodynamically stable.  General surgeon, Dr. Lady Gary is consulted.  -Admitted to progressive bed as inpatient -IV Zosyn (patient received 1 dose of Rocephin and Flagyl in ED) -Blood culture -As needed morphine and Zofran -NPO -will get Procalcitonin and trend lactic acid levels per sepsis protocol. -IVF: 3L of LR bolus in ED, followed by 100 cc/h   Hypothyroid: -change home Armour to IV Synthroid, decrease dose to from 90 to 50 mcg daily  Hypokalemia and hypomagnesemia: K 2.9 and Mg 1.5 -Repleted potassium with potassium chloride 10 mEq x 6 by IV plus 40 mEq orally -2 g of magnesium sulfate -Check phosphorus level  Tobacco abuse -Nicotine patch  Abnormal LFTs: Most likely due to gallstone.  Previous lipase is elevated to 43, but CT scan showed a normal pancreas. -Avoid using Tylenol  Mediastinal adenopathy: CTA showed mild mediastinal, right hilar and upper abdominal adenopathy -Need to follow-up with her PCP [facility] -need to give referral to oncology at discharge  Positive D-dimer: -f/u lower extremity Doppler to rule out DVT   DVT ppx: SCD Code Status: Full code Family Communication: Yes, patient's husband by phone Disposition Plan:  Anticipate  discharge back to previous environment Consults called:  Dr. Lady Gary of surgery Admission status and Level of care: Progressive Cardiac:    as inpt       Status is: Inpatient  Remains inpatient appropriate because:Inpatient level of care appropriate due to severity of illness   Dispo: The patient is from: Home              Anticipated d/c is to: Home              Patient currently is not medically stable to d/c.   Difficult to place patient No           Date of Service 05/05/2021    Lorretta Harp [facility]   If 7PM-7AM, please contact night-coverage [email or web address] 05/05/2021, 4:59 PM

## 2021-05-05 NOTE — ED Notes (Signed)
Lactic Acid 4.3 Katrinka Blazing, MD aware.

## 2021-05-05 NOTE — Progress Notes (Signed)
Pharmacy Antibiotic Note  Danielle Franklin is a 60 y.o. female of HTN, thyroid disease, and tobacco abuse  admitted on 05/05/2021 with cholecystitis.  Pharmacy has been consulted for Zosyn dosing.  Plan: start Zosyn 3.375g IV q8h (4 hour infusion)  Monitor renal function for needed dose adjustments  Height: 5\' 3"  (160 cm) Weight: 102 kg (224 lb 13.9 oz) IBW/kg (Calculated) : 52.4  Temp (24hrs), Avg:99.3 F (37.4 C), Min:99.3 F (37.4 C), Max:99.3 F (37.4 C)  Recent Labs  Lab 05/05/21 1113 05/05/21 1331  WBC 13.0*  --   CREATININE 0.90  --   LATICACIDVEN 4.7* 4.1*    Estimated Creatinine Clearance: 75.8 mL/min (by C-G formula based on SCr of 0.9 mg/dL).    Allergies  Allergen Reactions  . Caduet [Amlodipine-Atorvastatin]     Antimicrobials this admission: Zosyn 5/9 >>   Microbiology results: 5/9 BCx: pending 5/9 UCx: pending 5/9 SARS CoV-2: negative 5/9 influenza A/B: negative   Thank you for allowing pharmacy to be a part of this patient's care.  7/9 05/05/2021 4:18 PM

## 2021-05-05 NOTE — Sepsis Progress Note (Signed)
elink  Is following this code sepsis

## 2021-05-05 NOTE — ED Notes (Signed)
Patient at imaging.

## 2021-05-05 NOTE — ED Provider Notes (Signed)
[facility] Emergency Department Provider Note  ____________________________________________   Event Date/Time   First MD Initiated Contact with Patient 05/05/21 1111     (approximate)  I have reviewed the triage vital signs and the nursing notes.   HISTORY  Chief Complaint Weakness   HPI Danielle Franklin is a 60 y.o. female with a past medical history of HTN, thyroid disease, and tobacco abuse who presents EMS from home for assessment of 3 to 4 days of worsening generalized fatigue and achiness specifically in her lower back associated with little bit of shortness of breath with exertion.  She endorses chronic cough that has not changed significantly.  She endorses some pain in her left knee but feels worse than usual although she states she is sometimes chronic pain in both knees.  No recent falls or injuries.  She denies any chest pain, headache, or rash, urinary symptoms or vomiting but does endorse some diarrhea.  No other clear associated symptoms.  All symptoms are aggravated by minimal exertion.  No clear alleviating factors.         Past Medical History:  Diagnosis Date  . Hypertension   . Thyroid disease     Patient Active Problem List   Diagnosis Date Noted  . Tick borne fever 06/16/2015  . Hypokalemia 06/16/2015  . Hyperglycemia 06/16/2015  . Leukocytosis 06/16/2015  . Hypothyroid 06/15/2015  . Elevated cholesterol 06/15/2015  . Sepsis (HCC) 06/15/2015    Past Surgical History:  Procedure Laterality Date  . BREAST BIOPSY Left early 2000s   benign    Prior to Admission medications   Medication Sig Start Date End Date Taking? Authorizing Provider  ARMOUR THYROID 90 MG tablet Take 90 mg by mouth daily. 05/15/15  Yes [provider]  atenolol-chlorthalidone (TENORETIC) 50-25 MG tablet Take 1 tablet by mouth daily.   Yes [provider]  glimepiride (AMARYL) 1 MG tablet Take 1 mg by mouth daily. 04/09/21  Yes [provider]  lovastatin (MEVACOR) 40 MG tablet Take 1 tablet by mouth at bedtime. 05/15/15  Yes [provider]  potassium chloride (KLOR-CON) 10 MEQ tablet Take 10 mEq by mouth daily. 05/01/21  Yes [provider]  atenolol (TENORMIN) 100 MG tablet Take 1 tablet (100 mg total) by mouth daily. Patient not taking: Reported on 05/05/2021 06/18/15   Delfino Lovett, MD  doxycycline (VIBRA-TABS) 100 MG tablet Take 1 tablet (100 mg total) by mouth every 12 (twelve) hours. Patient not taking: No sig reported 06/18/15   Delfino Lovett, MD  ondansetron (ZOFRAN) 4 MG tablet Take 1 tablet (4 mg total) by mouth every 6 (six) hours as needed for nausea. Patient not taking: Reported on 05/05/2021 06/16/15   Katharina Caper, MD    Allergies Caduet [amlodipine-atorvastatin]  Family History  Problem Relation Age of Onset  . Diabetes Other   . Diabetes Mother   . Breast cancer Mother 12  . Diabetes Maternal Grandmother   . Breast cancer Maternal Grandmother     Social History Social History   Tobacco Use  . Smoking status: Current Every Day Smoker    Packs/day: 1.00  Substance Use Topics  . Alcohol use: No    Review of Systems  Review of Systems  Constitutional: Positive for malaise/fatigue. Negative for chills and fever.  HENT: Negative for sore throat.   Eyes: Negative for pain.  Respiratory: Positive for cough ( chronic) and shortness of breath. Negative for stridor.   Cardiovascular: Negative for  chest pain.  Gastrointestinal: Positive for diarrhea and nausea. Negative for vomiting.  Genitourinary: Negative for dysuria.  Musculoskeletal: Positive for back pain and myalgias.  Skin: Negative for rash.  Neurological: Positive for dizziness. Negative for seizures, loss of consciousness and headaches.  Psychiatric/Behavioral: Negative for suicidal ideas.  All other systems reviewed and are negative.     ____________________________________________   PHYSICAL EXAM:  VITAL  SIGNS: ED Triage Vitals  Enc Vitals Group     BP 05/05/21 1109 92/69     Pulse Rate 05/05/21 1109 (!) 111     Resp 05/05/21 1109 (!) 22     Temp 05/05/21 1109 99.3 F (37.4 C)     Temp Source 05/05/21 1109 Oral     SpO2 05/05/21 1105 97 %     Weight 05/05/21 1110 224 lb 13.9 oz (102 kg)     Height 05/05/21 1110  (1.6 m)     Head Circumference --      Peak Flow --      Pain Score 05/05/21 1107 5     Pain Loc --      Pain Edu? --      Excl. in GC? --    Vitals:   05/05/21 1400 05/05/21 1500  BP: 112/65 (!) 108/51  Pulse: 94 95  Resp: 18 18  Temp:    SpO2: 95% 96%   Physical Exam Vitals and nursing note reviewed.  Constitutional:      General: She is in acute distress.     Appearance: She is well-developed. She is obese. She is ill-appearing.  HENT:     Head: Normocephalic and atraumatic.     Right Ear: External ear normal.     Left Ear: External ear normal.     Nose: Nose normal.     Mouth/Throat:     Mouth: Mucous membranes are dry.  Eyes:     Conjunctiva/sclera: Conjunctivae normal.  Cardiovascular:     Rate and Rhythm: Regular rhythm. Tachycardia present.     Heart sounds: No murmur heard.   Pulmonary:     Effort: Pulmonary effort is normal. Tachypnea present. No respiratory distress.     Breath sounds: Normal breath sounds.  Abdominal:     Palpations: Abdomen is soft.     Tenderness: There is abdominal tenderness.  Musculoskeletal:     Cervical back: Neck supple.  Skin:    General: Skin is warm and dry.     Capillary Refill: Capillary refill takes more than 3 seconds.  Neurological:     Mental Status: She is alert and oriented to person, place, and time.  Psychiatric:        Mood and Affect: Mood normal.     2+ bilateral radial pulses.  No tenderness step-offs deformities over the C/T/L-spine but there is some bilateral paralumbar muscle tenderness.  No overlying skin changes.  Patient has symmetric strength in bilateral lower extremities.  Her  left knee has no large effusion or overlying skin changes or significant limitation range of motion compared to the right.  Strength is intact as is sensation to light touch throughout both lower extremities. ____________________________________________   LABS (all labs ordered are listed, but only abnormal results are displayed)  Labs Reviewed  LACTIC ACID, PLASMA - Abnormal; Notable for the following components:      Result Value   Lactic Acid, Venous 4.7 (*)    All other components within normal limits  LACTIC ACID, PLASMA - Abnormal; Notable for the  following components:   Lactic Acid, Venous 4.1 (*)    All other components within normal limits  COMPREHENSIVE METABOLIC PANEL - Abnormal; Notable for the following components:   Sodium 134 (*)    Potassium 2.9 (*)    Chloride 94 (*)    Glucose, Bld 202 (*)    Calcium 8.7 (*)    AST 277 (*)    ALT 145 (*)    Total Bilirubin 4.3 (*)    All other components within normal limits  CBC WITH DIFFERENTIAL/PLATELET - Abnormal; Notable for the following components:   WBC 13.0 (*)    Hemoglobin 15.1 (*)    Platelets 142 (*)    Neutro Abs 11.5 (*)    Lymphs Abs 0.5 (*)    Abs Immature Granulocytes 0.11 (*)    All other components within normal limits  URINALYSIS, COMPLETE (UACMP) WITH MICROSCOPIC - Abnormal; Notable for the following components:   Color, Urine AMBER (*)    APPearance CLOUDY (*)    Bilirubin Urine MODERATE (*)    Protein, ur 30 (*)    Non Squamous Epithelial PRESENT (*)    All other components within normal limits  D-DIMER, QUANTITATIVE - Abnormal; Notable for the following components:   D-Dimer, Quant 1.53 (*)    All other components within normal limits  MAGNESIUM - Abnormal; Notable for the following components:   Magnesium 1.5 (*)    All other components within normal limits  RESP PANEL BY RT-PCR (FLU A&B, COVID) ARPGX2  CULTURE, BLOOD (SINGLE)  URINE CULTURE  PROTIME-INR  APTT  CK  PREGNANCY, URINE   LIPASE, BLOOD  POC URINE PREG, ED  TROPONIN I (HIGH SENSITIVITY)  TROPONIN I (HIGH SENSITIVITY)   ____________________________________________  EKG  Sinus tachycardia with ventricular to 106, normal axis, unremarkable intervals without evidence of acute ischemia. ____________________________________________  RADIOLOGY  ED MD interpretation: Chest x-ray shows some infiltrates around the bilateral lingula versus atelectasis.  No overt edema, large effusion, pneumothorax or other clear acute thoracic process.  Official radiology report(s): CT Angio Chest PE W and/or Wo Contrast  Result Date: 05/05/2021 CLINICAL DATA:  Just weakness and low back pain. Suspect pulmonary embolism. Abdominal pain. EXAM: CT ANGIOGRAPHY CHEST CT ABDOMEN AND PELVIS WITH CONTRAST TECHNIQUE: Multidetector CT imaging of the chest was performed using the standard protocol during bolus administration of intravenous contrast. Multiplanar CT image reconstructions and MIPs were obtained to evaluate the vascular anatomy. Multidetector CT imaging of the abdomen and pelvis was performed using the standard protocol during bolus administration of intravenous contrast. CONTRAST:  14mL OMNIPAQUE IOHEXOL 350 MG/ML SOLN COMPARISON:  None. FINDINGS: CTA CHEST FINDINGS Cardiovascular: Borderline cardiomegaly. Minimal calcified plaque over the left main and 3 vessel coronary arteries. Thoracic aorta is normal caliber. Minimal calcified plaque over the thoracic aorta. Pulmonary arterial system is well opacified without evidence of emboli. Mediastinum/Nodes: Increased number of mediastinal lymph nodes with a few mildly enlarged mediastinal lymph nodes present. There is a 1.3 cm AP window lymph node. 1.1 cm 1.2 cm precarinal lymph node. 1.6 cm subcarinal lymph node. No left hilar adenopathy. 1.1 cm right hilar lymph node. 1 cm lymph node anterior to the distal esophagus. Remaining mediastinal structures are unremarkable. Lungs/Pleura: Lungs are  adequately inflated without evidence of effusion. Minimal linear density over the medial lingula and right middle lobe compatible with scarring/atelectasis. No focal airspace consolidation. Airways are normal. Musculoskeletal: Minimal degenerative change of the spine. Review of the MIP images confirms the above findings. CT ABDOMEN  and PELVIS FINDINGS Hepatobiliary: Moderate cholelithiasis. Liver and biliary tree are normal. Pancreas: Normal. Spleen: Normal. Adrenals/Urinary Tract: Adrenal glands are normal. Kidneys are normal in size without hydronephrosis or nephrolithiasis. Ureters and bladder are normal. Stomach/Bowel: Stomach is normal. Prominent diverticulum over the convex surface of the duodenal C sweep. Remainder of small bowel is unremarkable. Appendix is not visualized. Colon is normal. Vascular/Lymphatic: Calcified plaque over the abdominal aorta which is normal in caliber. Mild adenopathy in the region of the celiac axis with 1.2 cm lymph node noted as well as over the porta hepatis. Mild adenopathy over the gastrohepatic ligament with increased number of subcentimeter nodes over the retrocrural space and periaortic region. Reproductive: Normal. Other: No free fluid or focal inflammatory change. Musculoskeletal: Mild degenerative change of the spine. Review of the MIP images confirms the above findings. IMPRESSION: 1. No evidence of pulmonary embolism or acute cardiopulmonary disease. 2. Mild mediastinal, right hilar and upper abdominal adenopathy as described. Findings are nonspecific and may be due to neoplastic process such as leukemia/lymphoma or metastatic disease versus an infectious or inflammatory process. Recommend clinical correlation for primary neoplasm. 3. Moderate cholelithiasis. 4. Aortic atherosclerosis. Atherosclerotic coronary artery disease. Mild cardiomegaly. Aortic Atherosclerosis (ICD10-I70.0). Electronically Signed   By: Elberta Fortis M.D.   On: 05/05/2021 14:03   CT ABDOMEN  PELVIS W CONTRAST  Result Date: 05/05/2021 CLINICAL DATA:  Just weakness and low back pain. Suspect pulmonary embolism. Abdominal pain. EXAM: CT ANGIOGRAPHY CHEST CT ABDOMEN AND PELVIS WITH CONTRAST TECHNIQUE: Multidetector CT imaging of the chest was performed using the standard protocol during bolus administration of intravenous contrast. Multiplanar CT image reconstructions and MIPs were obtained to evaluate the vascular anatomy. Multidetector CT imaging of the abdomen and pelvis was performed using the standard protocol during bolus administration of intravenous contrast. CONTRAST:  45mL OMNIPAQUE IOHEXOL 350 MG/ML SOLN COMPARISON:  None. FINDINGS: CTA CHEST FINDINGS Cardiovascular: Borderline cardiomegaly. Minimal calcified plaque over the left main and 3 vessel coronary arteries. Thoracic aorta is normal caliber. Minimal calcified plaque over the thoracic aorta. Pulmonary arterial system is well opacified without evidence of emboli. Mediastinum/Nodes: Increased number of mediastinal lymph nodes with a few mildly enlarged mediastinal lymph nodes present. There is a 1.3 cm AP window lymph node. 1.1 cm 1.2 cm precarinal lymph node. 1.6 cm subcarinal lymph node. No left hilar adenopathy. 1.1 cm right hilar lymph node. 1 cm lymph node anterior to the distal esophagus. Remaining mediastinal structures are unremarkable. Lungs/Pleura: Lungs are adequately inflated without evidence of effusion. Minimal linear density over the medial lingula and right middle lobe compatible with scarring/atelectasis. No focal airspace consolidation. Airways are normal. Musculoskeletal: Minimal degenerative change of the spine. Review of the MIP images confirms the above findings. CT ABDOMEN and PELVIS FINDINGS Hepatobiliary: Moderate cholelithiasis. Liver and biliary tree are normal. Pancreas: Normal. Spleen: Normal. Adrenals/Urinary Tract: Adrenal glands are normal. Kidneys are normal in size without hydronephrosis or nephrolithiasis.  Ureters and bladder are normal. Stomach/Bowel: Stomach is normal. Prominent diverticulum over the convex surface of the duodenal C sweep. Remainder of small bowel is unremarkable. Appendix is not visualized. Colon is normal. Vascular/Lymphatic: Calcified plaque over the abdominal aorta which is normal in caliber. Mild adenopathy in the region of the celiac axis with 1.2 cm lymph node noted as well as over the porta hepatis. Mild adenopathy over the gastrohepatic ligament with increased number of subcentimeter nodes over the retrocrural space and periaortic region. Reproductive: Normal. Other: No free fluid or focal inflammatory change. Musculoskeletal:  Mild degenerative change of the spine. Review of the MIP images confirms the above findings. IMPRESSION: 1. No evidence of pulmonary embolism or acute cardiopulmonary disease. 2. Mild mediastinal, right hilar and upper abdominal adenopathy as described. Findings are nonspecific and may be due to neoplastic process such as leukemia/lymphoma or metastatic disease versus an infectious or inflammatory process. Recommend clinical correlation for primary neoplasm. 3. Moderate cholelithiasis. 4. Aortic atherosclerosis. Atherosclerotic coronary artery disease. Mild cardiomegaly. Aortic Atherosclerosis (ICD10-I70.0). Electronically Signed   By: Elberta Fortis M.D.   On: 05/05/2021 14:03   DG Chest Port 1 View  Result Date: 05/05/2021 CLINICAL DATA:  Questionable sepsis EXAM: PORTABLE CHEST 1 VIEW COMPARISON:  06/15/2015 FINDINGS: Lingular opacity, favor atelectasis although infiltrate cannot be excluded. No confluent opacity on the right. Heart is normal size. No effusions or acute bony abnormality. IMPRESSION: Lingular atelectasis or infiltrate. Electronically Signed   By: Charlett Nose M.D.   On: 05/05/2021 11:42   US ABDOMEN LIMITED RUQ (LIVER/GB)  Result Date: 05/05/2021 CLINICAL DATA:  Elevated liver enzymes EXAM: ULTRASOUND ABDOMEN LIMITED RIGHT UPPER QUADRANT  COMPARISON:  CT abdomen and pelvis May 05, 2021 FINDINGS: Gallbladder: Within the gallbladder, there are echogenic foci which move and shadow consistent with cholelithiasis. Largest gallstone measures 1.1 cm. There is gallbladder wall thickening. No appreciable pericholecystic fluid. No sonographic Murphy sign noted by sonographer. Common bile duct: Diameter: 4 mm. No intrahepatic or extrahepatic biliary duct dilatation. Liver: No focal lesion identified. Liver echogenicity is increased diffusely. Portal vein is patent on color Doppler imaging with normal direction of blood flow towards the liver. Other: None. IMPRESSION: 1. Cholelithiasis with gallbladder wall thickening. Concern for a degree of acute cholecystitis. This finding may warrant nuclear medicine hepatobiliary imaging study to assess for cystic duct patency. 2. Increased liver echogenicity, a finding indicative of hepatic steatosis. No focal liver lesions evident by ultrasound. Note that the sensitivity of ultrasound for detection of focal liver lesions is diminished in this circumstance. Electronically Signed   By: Bretta Bang III M.D.   On: 05/05/2021 15:06    ____________________________________________   PROCEDURES  Procedure(s) performed (including Critical Care):  .Critical Care Performed by: Gilles Chiquito, MD Authorized by: Gilles Chiquito, MD   Critical care provider statement:    Critical care time (minutes):  45   Critical care was necessary to treat or prevent imminent or life-threatening deterioration of the following conditions:  Shock, sepsis and respiratory failure   Critical care was time spent personally by me on the following activities:  Discussions with consultants, evaluation of patient's response to treatment, examination of patient, ordering and performing treatments and interventions, ordering and review of laboratory studies, ordering and review of radiographic studies, pulse oximetry, re-evaluation of  patient's condition, obtaining history from patient or surrogate and review of old charts     ____________________________________________   INITIAL IMPRESSION / ASSESSMENT AND PLAN / ED COURSE        Patient presents with above-stated history exam for assessment of multiple symptoms noted above including little shortness of breath, fatigue, myalgias including in the lower back and in the left knee although she has not had any recent injuries or falls.  On arrival she is little hypertensive with a BP of 92/69, afebrile, tachycardic at 111, tachypneic at 22 and 93% on room air.  She was placed on 2 L with improvement SPO2 96%.  On exam she has some tenderness over her bilateral lower back without any overlying skin changes and  no areas of clear point tenderness overlying skin changes or evidence of clear trauma or infection of the left knee or she also reports some increased pain from baseline.  Her lungs are clear but diminished bilaterally and she is tachypneic.  Differential includes but is not limited to acute infectious process, metabolic derangements, urinary tract infection, PE, cholecystitis, pancreatitis and kidney stone.  Unclear etiology of patient's shortness in her left knee  CBC with leukocytosis with WBC count of 13, hemoglobin of 15.1 and normal platelets.  CMP remarkable for K of 2.9, glucose of 202, AST of 277, ALT of 145 and a T bili of 4.3.  INR is unremarkable.  Troponin is nonelevated not consistent with ACS.  D-dimer is elevated at 1.53.  Magnesium is 1.5.  CK is normal.  COVID and flu is negative.  Initial lactic acid elevated 4.7.  Given abnormal vital signs with leukocytosis and elevated lactic acid on arrival concern for sepsis.  Patient given Rocephin Flagyl and IV fluids after blood and urine cultures obtained.  Of an elevated D-dimer and hypoxia CTA chest obtained.  This does not show evidence of PE clearance of pneumonia pneumothorax or large effusion but there is  evidence of significant lymphadenopathy.  In addition given transaminitis and elevated bilirubin with patient reporting some back pain CT abdomen pelvis obtained with concern for possible cholecystitis or cholangitis.  CT abdomen pelvis with evidence of gallstones without evidence on CT of cholecystitis, dilated common bile duct or other clear acute abdominal pelvic pathology although there is evidence of aortic atherosclerosis.  Given transaminitis and elevated bilirubin did obtain a right upper quadrant ultrasound as well.  Right upper quadrant ultrasound did show evidence of gallbladder wall thickening concerning for acute cholecystitis as well as evidence of hepatic steatosis.  Discussed with on-call general surgeon Dr. Lady Garyannon who recommended medicine admission for IV antibiotics and resuscitation stated they would follow along but do not recommend acute surgical intervention at this time.  I will admit to medicine service.     ____________________________________________   FINAL CLINICAL IMPRESSION(S) / ED DIAGNOSES  Final diagnoses:  Hypokalemia  Hypomagnesemia  Sepsis, due to unspecified organism, unspecified whether acute organ dysfunction present (HCC)  Shock (HCC)  Bilirubinemia  Cholecystitis  Positive D dimer    Medications  potassium chloride 10 mEq in 100 mL IVPB (10 mEq Intravenous New Bag/Given 05/05/21 1457)  lactated ringers infusion ( Intravenous New Bag/Given 05/05/21 1340)  lactated ringers bolus 1,000 mL (0 mLs Intravenous Stopped 05/05/21 1331)  lactated ringers bolus 1,000 mL (0 mLs Intravenous Stopped 05/05/21 1456)  cefTRIAXone (ROCEPHIN) 1 g in sodium chloride 0.9 % 100 mL IVPB (0 g Intravenous Stopped 05/05/21 1300)  magnesium sulfate IVPB 2 g 50 mL (0 g Intravenous Stopped 05/05/21 1443)  metroNIDAZOLE (FLAGYL) IVPB 500 mg (0 mg Intravenous Stopped 05/05/21 1445)  iohexol (OMNIPAQUE) 350 MG/ML injection 75 mL (75 mLs Intravenous Contrast Given 05/05/21 1309)  fentaNYL  (SUBLIMAZE) injection 50 mcg (50 mcg Intravenous Given 05/05/21 1513)     ED Discharge Orders    None       Note:  This document was prepared using Dragon voice recognition software and may include unintentional dictation errors.   Gilles ChiquitoSmith, Mecca Barga P, MD 05/05/21 [id number]

## 2021-05-06 ENCOUNTER — Other Ambulatory Visit: Payer: Self-pay

## 2021-05-06 ENCOUNTER — Inpatient Hospital Stay: Payer: 59

## 2021-05-06 ENCOUNTER — Encounter: Payer: Self-pay | Admitting: Internal Medicine

## 2021-05-06 DIAGNOSIS — R590 Localized enlarged lymph nodes: Secondary | ICD-10-CM

## 2021-05-06 DIAGNOSIS — K819 Cholecystitis, unspecified: Secondary | ICD-10-CM

## 2021-05-06 LAB — HEPATIC FUNCTION PANEL
ALT: 96 U/L — ABNORMAL HIGH (ref 0–44)
AST: 137 U/L — ABNORMAL HIGH (ref 15–41)
Albumin: 3 g/dL — ABNORMAL LOW (ref 3.5–5.0)
Alkaline Phosphatase: 59 U/L (ref 38–126)
Bilirubin, Direct: 2.3 mg/dL — ABNORMAL HIGH (ref 0.0–0.2)
Indirect Bilirubin: 1.6 mg/dL — ABNORMAL HIGH (ref 0.3–0.9)
Total Bilirubin: 3.9 mg/dL — ABNORMAL HIGH (ref 0.3–1.2)
Total Protein: 6.3 g/dL — ABNORMAL LOW (ref 6.5–8.1)

## 2021-05-06 LAB — BASIC METABOLIC PANEL
Anion gap: 7 (ref 5–15)
BUN: 13 mg/dL (ref 6–20)
CO2: 29 mmol/L (ref 22–32)
Calcium: 8.2 mg/dL — ABNORMAL LOW (ref 8.9–10.3)
Chloride: 102 mmol/L (ref 98–111)
Creatinine, Ser: 0.58 mg/dL (ref 0.44–1.00)
GFR, Estimated: >60 mL/min (ref >60)
Glucose, Bld: 106 mg/dL — ABNORMAL HIGH (ref 70–99)
Potassium: 3.7 mmol/L (ref 3.5–5.1)
Sodium: 138 mmol/L (ref 135–145)

## 2021-05-06 LAB — LACTIC ACID, PLASMA
Lactic Acid, Venous: 1.2 mmol/L (ref 0.5–1.9)
Lactic Acid, Venous: 1.2 mmol/L (ref 0.5–1.9)

## 2021-05-06 LAB — CBC
HCT: 39.9 % (ref 36.0–46.0)
Hemoglobin: 13.5 g/dL (ref 12.0–15.0)
MCH: 31.4 pg (ref 26.0–34.0)
MCHC: 33.8 g/dL (ref 30.0–36.0)
MCV: 92.8 fL (ref 80.0–100.0)
Platelets: 129 10*3/uL — ABNORMAL LOW (ref 150–400)
RBC: 4.3 MIL/uL (ref 3.87–5.11)
RDW: 14.2 % (ref 11.5–15.5)
WBC: 9.5 10*3/uL (ref 4.0–10.5)
nRBC: 0 % (ref 0.0–0.2)

## 2021-05-06 LAB — GLUCOSE, CAPILLARY
Glucose-Capillary: 111 mg/dL — ABNORMAL HIGH (ref 70–99)
Glucose-Capillary: 109 mg/dL — ABNORMAL HIGH (ref 70–99)
Glucose-Capillary: 106 mg/dL — ABNORMAL HIGH (ref 70–99)
Glucose-Capillary: 84 mg/dL (ref 70–99)

## 2021-05-06 LAB — LIPASE, BLOOD: Lipase: 65 U/L — ABNORMAL HIGH (ref 11–51)

## 2021-05-06 LAB — MAGNESIUM: Magnesium: 2.2 mg/dL (ref 1.7–2.4)

## 2021-05-06 LAB — PROCALCITONIN: Procalcitonin: 4.59 ng/mL

## 2021-05-06 MED ORDER — GADOBUTROL 1 MMOL/ML IV SOLN: 10.0000 mL | Freq: Once | INTRAVENOUS | Status: DC | PRN

## 2021-05-06 MED ORDER — THYROID 60 MG PO TABS
90.0000 mg | ORAL_TABLET | Freq: Every day | ORAL | Status: DC
  Administered 2021-05-07 – 2021-05-09 (×3): 90 mg via ORAL
  Filled 2021-05-06 (×4): qty 1

## 2021-05-06 MED ORDER — ORAL CARE MOUTH RINSE
15.0000 mL | Freq: Two times a day (BID) | OROMUCOSAL | Status: DC
  Administered 2021-05-06 – 2021-05-08 (×5): 15 mL via OROMUCOSAL

## 2021-05-06 NOTE — Progress Notes (Signed)
PROGRESS NOTE    Danielle Franklin  UMP:[id number] DOB: 1961-12-04 DOA: 05/05/2021 PCP: Gracelyn Nurse, MD   Brief Narrative:  60 y.o. female with medical history significant of hypertension, hyperlipidemia, diabetes mellitus, hypothyroidism, tobacco abuse, tickborne fever, who presents with nausea, vomiting, weakness.  Patient states that she has not been feeling well for almost a week.  She has nausea and had a multiple times of nonbilious nonbloody vomiting.  No diarrhea, denies abdominal pain.  She she had chills, but no fever at home.  No symptoms of UTI.  Patient has a chronic mild shortness of breath and in mild withdrawal coughing which she attributes to smoking, no significant change.  Chest CT showed no evidence of pulmonary embolism however did show significant m thoracic adenopathy concerning for possible malignancy.  Results of abdominal CT were negative for cholecystitis however follow-up ultrasound did show some evidence of gallbladder wall thickening.  General surgery consulted from ED with recommendations to admit to medicine and start IV antibiotics.  General surgery to make recommendations regarding management of possible cholecystitis.   Assessment & Plan:   Principal Problem:   Acute cholecystitis Active Problems:   Hypothyroid   Hypokalemia   Severe sepsis with septic shock (HCC)   Tobacco abuse   Hypomagnesemia   Mediastinal adenopathy   Abnormal LFTs  Severe sepsis with septic shock due to acute gallstone cholecystitis  Patient meets criteria for severe sepsis with septic shock. has leukocytosis with WBC 13.0, tachycardia with heart rate of 111, tachypnea with RR 23, lactic acid is elevated up to 4.7 --> 4.1.   Currently blood pressure is soft, and hemodynamically stable.   Lactic acid has normalized as of 05/06/2021 General surgeon, Dr. Lady Gary is consulted. Plan: Continue PCU status Continue intravenous Zosyn Follow blood cultures, no growth to date As  needed pain control Bowel rest.  N.p.o. for now pending general surgery recommendations Trend procalcitonin Monitor vitals and fever curve  Hypothyroid -change home Armour to IV Synthroid, decrease dose to from 90 to 50 mcg daily -Can transition to p.o. once diet is advanced  Hypokalemia and hypomagnesemia  K 2.9 and Mg 1.5 on admission -Repleted potassium with potassium chloride 10 mEq x 6 by IV plus 40 mEq orally -2 g of magnesium sulfate -Electrolytes did respond replacement Plan: Daily labs.  Replace electrolytes as necessary  Tobacco abuse -Nicotine patch -Per documentation patient has been smoking since age 20 without plans to quit  Abnormal LFTs  Most likely due to gallstone.   Previous lipase is elevated to 43, but CT scan showed a normal pancreas. Transaminitis is improving slowly Patient not acutely cholangitic Plan: Daily CMP Avoid hepatotoxins  Mediastinal adenopathy CTA showed mild mediastinal, right hilar and upper abdominal adenopathy Given her smoking history very concerned about malignancy Plan: Outpatient follow-up with PCP Would benefit from referral to oncology at time of discharge  Positive D-dimer: -CTA lower extremity duplex negative   DVT prophylaxis: SCD Code Status: Full Family Communication: None today.  Offered to call but the patient declined Disposition Plan: Status is: Inpatient  Remains inpatient appropriate because:Inpatient level of care appropriate due to severity of illness   Dispo: The patient is from: Home              Anticipated d/c is to: Home              Patient currently is not medically stable to d/c.   Difficult to place patient No  Suspected acute calculus cholecystitis.  General surgery on consult.  Currently no plans for surgical intervention.  Pending formal surgical evaluation.  On IV antibiotics and intravenous fluids.     Level of care: Progressive Cardiac  Consultants:   General  surgery  Procedures:   None  Antimicrobials:  Zosyn   Subjective: Seen and examined.  Endorses hunger and abdominal pain.  Objective: Vitals:   05/06/21 0154 05/06/21 0200 05/06/21 0523 05/06/21 0714  BP: (!) 82/47 93/69 (!) 98/51 96/62  Pulse: 83 84 89 91  Resp: 20 20 18 20   Temp:   98 F (36.7 C) 97.9 F (36.6 C)  TempSrc:   Oral Oral  SpO2: 96% 91% 90% 92%  Weight:  104 kg  103.5 kg  Height:        Intake/Output Summary (Last 24 hours) at 05/06/2021 1036 Last data filed at 05/06/2021 0300 Gross per 24 hour  Intake 4425.79 ml  Output --  Net 4425.79 ml   Filed Weights   05/05/21 2121 05/06/21 0200 05/06/21 0714  Weight: 104 kg 104 kg 103.5 kg    Examination:  General exam: Appears calm and comfortable  Respiratory system: Scattered crackles bilaterally.  Normal work of breathing.  Room air Cardiovascular system: S1 & S2 heard, RRR. No JVD, murmurs, rubs, gallops or clicks. No pedal edema. Gastrointestinal system: Soft, nondistended, tender to palpation right upper quadrant, normal bowel sounds  Central nervous system: Alert and oriented. No focal neurological deficits. Extremities: Symmetric 5 x 5 power. Skin: No rashes, lesions or ulcers Psychiatry: Judgement and insight appear normal. Mood & affect appropriate.     Data Reviewed: I have personally reviewed following labs and imaging studies  CBC: Recent Labs  Lab 05/05/21 1113 05/06/21 0514  WBC 13.0* 9.5  NEUTROABS 11.5*  --   HGB 15.1* 13.5  HCT 44.8 39.9  MCV 91.6 92.8  PLT 142* 129*   Basic Metabolic Panel: Recent Labs  Lab 05/05/21 1113 05/05/21 1139 05/06/21 0514  NA 134*  --  138  K 2.9*  --  3.7  CL 94*  --  102  CO2 25  --  29  GLUCOSE 202*  --  106*  BUN 19  --  13  CREATININE 0.90  --  0.58  CALCIUM 8.7*  --  8.2*  MG 1.5*  --  2.2  PHOS  --  3.6  --    GFR: Estimated Creatinine Clearance: 85.9 mL/min (by C-G formula based on SCr of 0.58 mg/dL). Liver Function  Tests: Recent Labs  Lab 05/05/21 1113 05/06/21 0929  AST 277* 137*  ALT 145* 96*  ALKPHOS 72 59  BILITOT 4.3* 3.9*  PROT 7.0 6.3*  ALBUMIN 3.5 3.0*   Recent Labs  Lab 05/05/21 1423 05/06/21 0929  LIPASE 243* 65*   No results for input(s): AMMONIA in the last 168 hours. Coagulation Profile: Recent Labs  Lab 05/05/21 1113  INR 1.1   Cardiac Enzymes: Recent Labs  Lab 05/05/21 1113  CKTOTAL 62   BNP (last 3 results) No results for input(s): PROBNP in the last 8760 hours. HbA1C: No results for input(s): HGBA1C in the last 72 hours. CBG: Recent Labs  Lab 05/05/21 1756 05/05/21 2119 05/06/21 0752  GLUCAP 138* 115* 111*   Lipid Profile: No results for input(s): CHOL, HDL, LDLCALC, TRIG, CHOLHDL, LDLDIRECT in the last 72 hours. Thyroid Function Tests: No results for input(s): TSH, T4TOTAL, FREET4, T3FREE, THYROIDAB in the last 72 hours. Anemia Panel: No results for input(s): VITAMINB12, FOLATE, FERRITIN,  TIBC, IRON, RETICCTPCT in the last 72 hours. Sepsis Labs: Recent Labs  Lab 05/05/21 1113 05/05/21 1331 05/05/21 1551 05/05/21 1757 05/06/21 0929  PROCALCITON  --   --  14.52  --   --   LATICACIDVEN 4.7* 4.1*  --  2.0* 1.2    Recent Results (from the past 240 hour(s))  Resp Panel by RT-PCR (Flu A&B, Covid) Nasopharyngeal Swab     Status: None   Collection Time: 05/05/21 11:13 AM   Specimen: Nasopharyngeal Swab; Nasopharyngeal(NP) swabs in vial transport medium  Result Value Ref Range Status   SARS Coronavirus 2 by RT PCR NEGATIVE NEGATIVE Final    Comment: (NOTE) SARS-CoV-2 target nucleic acids are NOT DETECTED.  The SARS-CoV-2 RNA is generally detectable in upper respiratory specimens during the acute phase of infection. The lowest concentration of SARS-CoV-2 viral copies this assay can detect is 138 copies/mL. A negative result does not preclude SARS-Cov-2 infection and should not be used as the sole basis for treatment or other patient management  decisions. A negative result may occur with  improper specimen collection/handling, submission of specimen other than nasopharyngeal swab, presence of viral mutation(s) within the areas targeted by this assay, and inadequate number of viral copies(<138 copies/mL). A negative result must be combined with clinical observations, patient history, and epidemiological information. The expected result is Negative.  Fact Sheet for Patients:  BloggerCourse.com  Fact Sheet for Healthcare Providers:  SeriousBroker.it  This test is no t yet approved or cleared by the [place] FDA and  has been authorized for detection and/or diagnosis of SARS-CoV-2 by FDA under an Emergency Use Authorization (EUA). This EUA will remain  in effect (meaning this test can be used) for the duration of the COVID-19 declaration under Section 564(b)(1) of the Act, 21 U.S.C.section 360bbb-3(b)(1), unless the authorization is terminated  or revoked sooner.       Influenza A by PCR NEGATIVE NEGATIVE Final   Influenza B by PCR NEGATIVE NEGATIVE Final    Comment: (NOTE) The Xpert Xpress SARS-CoV-2/FLU/RSV plus assay is intended as an aid in the diagnosis of influenza from Nasopharyngeal swab specimens and should not be used as a sole basis for treatment. Nasal washings and aspirates are unacceptable for Xpert Xpress SARS-CoV-2/FLU/RSV testing.  Fact Sheet for Patients: BloggerCourse.com  Fact Sheet for Healthcare Providers: SeriousBroker.it  This test is not yet approved or cleared by the [place] FDA and has been authorized for detection and/or diagnosis of SARS-CoV-2 by FDA under an Emergency Use Authorization (EUA). This EUA will remain in effect (meaning this test can be used) for the duration of the COVID-19 declaration under Section 564(b)(1) of the Act, 21 U.S.C. section 360bbb-3(b)(1), unless the  authorization is terminated or revoked.  Performed at [facility], [address] [place], [place] [address]          Radiology Studies: CT Angio Chest PE W and/or Wo Contrast  Result Date: 05/05/2021 CLINICAL DATA:  Just weakness and low back pain. Suspect pulmonary embolism. Abdominal pain. EXAM: CT ANGIOGRAPHY CHEST CT ABDOMEN AND PELVIS WITH CONTRAST TECHNIQUE: Multidetector CT imaging of the chest was performed using the standard protocol during bolus administration of intravenous contrast. Multiplanar CT image reconstructions and MIPs were obtained to evaluate the vascular anatomy. Multidetector CT imaging of the abdomen and pelvis was performed using the standard protocol during bolus administration of intravenous contrast. CONTRAST:  75mL OMNIPAQUE IOHEXOL 350 MG/ML SOLN COMPARISON:  None. FINDINGS: CTA CHEST FINDINGS Cardiovascular: Borderline cardiomegaly. Minimal  calcified plaque over the left main and 3 vessel coronary arteries. Thoracic aorta is normal caliber. Minimal calcified plaque over the thoracic aorta. Pulmonary arterial system is well opacified without evidence of emboli. Mediastinum/Nodes: Increased number of mediastinal lymph nodes with a few mildly enlarged mediastinal lymph nodes present. There is a 1.3 cm AP window lymph node. 1.1 cm 1.2 cm precarinal lymph node. 1.6 cm subcarinal lymph node. No left hilar adenopathy. 1.1 cm right hilar lymph node. 1 cm lymph node anterior to the distal esophagus. Remaining mediastinal structures are unremarkable. Lungs/Pleura: Lungs are adequately inflated without evidence of effusion. Minimal linear density over the medial lingula and right middle lobe compatible with scarring/atelectasis. No focal airspace consolidation. Airways are normal. Musculoskeletal: Minimal degenerative change of the spine. Review of the MIP images confirms the above findings. CT ABDOMEN and PELVIS FINDINGS Hepatobiliary: Moderate cholelithiasis. Liver  and biliary tree are normal. Pancreas: Normal. Spleen: Normal. Adrenals/Urinary Tract: Adrenal glands are normal. Kidneys are normal in size without hydronephrosis or nephrolithiasis. Ureters and bladder are normal. Stomach/Bowel: Stomach is normal. Prominent diverticulum over the convex surface of the duodenal C sweep. Remainder of small bowel is unremarkable. Appendix is not visualized. Colon is normal. Vascular/Lymphatic: Calcified plaque over the abdominal aorta which is normal in caliber. Mild adenopathy in the region of the celiac axis with 1.2 cm lymph node noted as well as over the porta hepatis. Mild adenopathy over the gastrohepatic ligament with increased number of subcentimeter nodes over the retrocrural space and periaortic region. Reproductive: Normal. Other: No free fluid or focal inflammatory change. Musculoskeletal: Mild degenerative change of the spine. Review of the MIP images confirms the above findings. IMPRESSION: 1. No evidence of pulmonary embolism or acute cardiopulmonary disease. 2. Mild mediastinal, right hilar and upper abdominal adenopathy as described. Findings are nonspecific and may be due to neoplastic process such as leukemia/lymphoma or metastatic disease versus an infectious or inflammatory process. Recommend clinical correlation for primary neoplasm. 3. Moderate cholelithiasis. 4. Aortic atherosclerosis. Atherosclerotic coronary artery disease. Mild cardiomegaly. Aortic Atherosclerosis (ICD10-I70.0). Electronically Signed   By: Elberta Fortis M.D.   On: 05/05/2021 14:03   CT ABDOMEN PELVIS W CONTRAST  Result Date: 05/05/2021 CLINICAL DATA:  Just weakness and low back pain. Suspect pulmonary embolism. Abdominal pain. EXAM: CT ANGIOGRAPHY CHEST CT ABDOMEN AND PELVIS WITH CONTRAST TECHNIQUE: Multidetector CT imaging of the chest was performed using the standard protocol during bolus administration of intravenous contrast. Multiplanar CT image reconstructions and MIPs were obtained  to evaluate the vascular anatomy. Multidetector CT imaging of the abdomen and pelvis was performed using the standard protocol during bolus administration of intravenous contrast. CONTRAST:  75mL OMNIPAQUE IOHEXOL 350 MG/ML SOLN COMPARISON:  None. FINDINGS: CTA CHEST FINDINGS Cardiovascular: Borderline cardiomegaly. Minimal calcified plaque over the left main and 3 vessel coronary arteries. Thoracic aorta is normal caliber. Minimal calcified plaque over the thoracic aorta. Pulmonary arterial system is well opacified without evidence of emboli. Mediastinum/Nodes: Increased number of mediastinal lymph nodes with a few mildly enlarged mediastinal lymph nodes present. There is a 1.3 cm AP window lymph node. 1.1 cm 1.2 cm precarinal lymph node. 1.6 cm subcarinal lymph node. No left hilar adenopathy. 1.1 cm right hilar lymph node. 1 cm lymph node anterior to the distal esophagus. Remaining mediastinal structures are unremarkable. Lungs/Pleura: Lungs are adequately inflated without evidence of effusion. Minimal linear density over the medial lingula and right middle lobe compatible with scarring/atelectasis. No focal airspace consolidation. Airways are normal. Musculoskeletal: Minimal degenerative change  of the spine. Review of the MIP images confirms the above findings. CT ABDOMEN and PELVIS FINDINGS Hepatobiliary: Moderate cholelithiasis. Liver and biliary tree are normal. Pancreas: Normal. Spleen: Normal. Adrenals/Urinary Tract: Adrenal glands are normal. Kidneys are normal in size without hydronephrosis or nephrolithiasis. Ureters and bladder are normal. Stomach/Bowel: Stomach is normal. Prominent diverticulum over the convex surface of the duodenal C sweep. Remainder of small bowel is unremarkable. Appendix is not visualized. Colon is normal. Vascular/Lymphatic: Calcified plaque over the abdominal aorta which is normal in caliber. Mild adenopathy in the region of the celiac axis with 1.2 cm lymph node noted as well as  over the porta hepatis. Mild adenopathy over the gastrohepatic ligament with increased number of subcentimeter nodes over the retrocrural space and periaortic region. Reproductive: Normal. Other: No free fluid or focal inflammatory change. Musculoskeletal: Mild degenerative change of the spine. Review of the MIP images confirms the above findings. IMPRESSION: 1. No evidence of pulmonary embolism or acute cardiopulmonary disease. 2. Mild mediastinal, right hilar and upper abdominal adenopathy as described. Findings are nonspecific and may be due to neoplastic process such as leukemia/lymphoma or metastatic disease versus an infectious or inflammatory process. Recommend clinical correlation for primary neoplasm. 3. Moderate cholelithiasis. 4. Aortic atherosclerosis. Atherosclerotic coronary artery disease. Mild cardiomegaly. Aortic Atherosclerosis (ICD10-I70.0). Electronically Signed   By: Elberta Fortisaniel  Boyle M.D.   On: 05/05/2021 14:03   US Venous Img Lower Bilateral (DVT)  Result Date: 05/05/2021 CLINICAL DATA:  Positive D-dimer. EXAM: BILATERAL LOWER EXTREMITY VENOUS DOPPLER ULTRASOUND TECHNIQUE: Gray-scale sonography with graded compression, as well as color Doppler and duplex ultrasound were performed to evaluate the lower extremity deep venous systems from the level of the common femoral vein and including the common femoral, femoral, profunda femoral, popliteal and calf veins including the posterior tibial, peroneal and gastrocnemius veins when visible. The superficial great saphenous vein was also interrogated. Spectral Doppler was utilized to evaluate flow at rest and with distal augmentation maneuvers in the common femoral, femoral and popliteal veins. COMPARISON:  None. FINDINGS: RIGHT LOWER EXTREMITY Common Femoral Vein: No evidence of thrombus. Normal compressibility, respiratory phasicity and response to augmentation. Saphenofemoral Junction: No evidence of thrombus. Normal compressibility and flow on color  Doppler imaging. Profunda Femoral Vein: No evidence of thrombus. Normal compressibility and flow on color Doppler imaging. Femoral Vein: No evidence of thrombus. Normal compressibility, respiratory phasicity and response to augmentation. Popliteal Vein: No evidence of thrombus. Normal compressibility, respiratory phasicity and response to augmentation. Calf Veins: No evidence of thrombus. Normal compressibility and flow on color Doppler imaging. Superficial Great Saphenous Vein: No evidence of thrombus. Normal compressibility. Venous Reflux:  None. Other Findings:  None. LEFT LOWER EXTREMITY Common Femoral Vein: No evidence of thrombus. Normal compressibility, respiratory phasicity and response to augmentation. Saphenofemoral Junction: No evidence of thrombus. Normal compressibility and flow on color Doppler imaging. Profunda Femoral Vein: No evidence of thrombus. Normal compressibility and flow on color Doppler imaging. Femoral Vein: No evidence of thrombus. Normal compressibility, respiratory phasicity and response to augmentation. Popliteal Vein: No evidence of thrombus. Normal compressibility, respiratory phasicity and response to augmentation. Calf Veins: No evidence of thrombus. Normal compressibility and flow on color Doppler imaging. Superficial Great Saphenous Vein: No evidence of thrombus. Normal compressibility. Venous Reflux:  None. Other Findings:  None. IMPRESSION: No evidence of deep venous thrombosis in either lower extremity. Electronically Signed   By: Elberta Fortisaniel  Boyle M.D.   On: 05/05/2021 18:25   DG Chest Port 1 View  Result Date: 05/05/2021  CLINICAL DATA:  Questionable sepsis EXAM: PORTABLE CHEST 1 VIEW COMPARISON:  06/15/2015 FINDINGS: Lingular opacity, favor atelectasis although infiltrate cannot be excluded. No confluent opacity on the right. Heart is normal size. No effusions or acute bony abnormality. IMPRESSION: Lingular atelectasis or infiltrate. Electronically Signed   By: Charlett Nose  M.D.   On: 05/05/2021 11:42   US ABDOMEN LIMITED RUQ (LIVER/GB)  Result Date: 05/05/2021 CLINICAL DATA:  Elevated liver enzymes EXAM: ULTRASOUND ABDOMEN LIMITED RIGHT UPPER QUADRANT COMPARISON:  CT abdomen and pelvis May 05, 2021 FINDINGS: Gallbladder: Within the gallbladder, there are echogenic foci which move and shadow consistent with cholelithiasis. Largest gallstone measures 1.1 cm. There is gallbladder wall thickening. No appreciable pericholecystic fluid. No sonographic Murphy sign noted by sonographer. Common bile duct: Diameter: 4 mm. No intrahepatic or extrahepatic biliary duct dilatation. Liver: No focal lesion identified. Liver echogenicity is increased diffusely. Portal vein is patent on color Doppler imaging with normal direction of blood flow towards the liver. Other: None. IMPRESSION: 1. Cholelithiasis with gallbladder wall thickening. Concern for a degree of acute cholecystitis. This finding may warrant nuclear medicine hepatobiliary imaging study to assess for cystic duct patency. 2. Increased liver echogenicity, a finding indicative of hepatic steatosis. No focal liver lesions evident by ultrasound. Note that the sensitivity of ultrasound for detection of focal liver lesions is diminished in this circumstance. Electronically Signed   By: Bretta Bang III M.D.   On: 05/05/2021 15:06        Scheduled Meds: . insulin aspart  0-5 Units Subcutaneous QHS  . insulin aspart  0-9 Units Subcutaneous TID WC  . levothyroxine  50 mcg Intravenous Daily  . mouth rinse  15 mL Mouth Rinse BID  . nicotine  21 mg Transdermal Daily  . pravastatin  40 mg Oral q1800   Continuous Infusions: . lactated ringers 100 mL/hr at 05/06/21 0528  . piperacillin-tazobactam (ZOSYN)  IV 3.375 g (05/06/21 0536)     LOS: 1 day    Time spent: 25 minutes    Tresa Moore, MD [facility] Pager [phone]  If 7PM-7AM, please contact night-coverage 05/06/2021, 10:36 AM

## 2021-05-06 NOTE — Consult Note (Addendum)
[facility] SURGICAL CONSULTATION NOTE (initial) - cpt: [phone]   HISTORY OF PRESENT ILLNESS (HPI):  60 y.o. female presented to [facility] ED yesterday for evaluation of generalized weakness and fatigue which had been worsening over the lst 3-4 days. She had relatively vague complaints of fatigue and SOB which seemed to be exacerbated with exertion. No fever, chills, nausea, or emesis. No previous intra-abdominal surgeries. Work up revealed a leukocytosis to 13.0K (now resolved at 9.0K), hypokalemia to 2.9 (resolved to 3.7), Hyperbilirubinemia to 4.3 (persistent to 3.9 with direct being 2.3), mild lipase elevated to 243 (now 65), and a lactic acidosis to 4.7K (now resolved at 1.2). She would undergo CTA for PE rule out which was negative. Work up also included CT Abdomen/Pelvis which was most concerning for mediastinal lymphadenopathy but there was cholelithiasis seen. She would have follow up RUQ [place] which was concerning for cholelithiasis and questionable wall thickening. She would ultimately be admitted to the medicine service for presumed sepsis. She is currently on Zosyn.    Surgery is consulted by emergency medicine physician Dr. Hulan Saas, MD in this context for evaluation and management of possible cholecystitis.   PAST MEDICAL HISTORY (PMH):  Past Medical History:  Diagnosis Date   Diabetes mellitus without complication ([place])    Hypertension    Thyroid disease      PAST SURGICAL HISTORY ([place]):  Past Surgical History:  Procedure Laterality Date   BREAST BIOPSY Left early 2000s   benign     MEDICATIONS:  Prior to Admission medications   Medication Sig Start Date End Date Taking? Authorizing Provider  ARMOUR THYROID 90 MG tablet Take 90 mg by mouth daily. 05/15/15  Yes [provider]  atenolol-chlorthalidone (TENORETIC) 50-25 MG tablet Take 1 tablet by mouth daily.   Yes [provider]  glimepiride (AMARYL) 1 MG tablet Take 1 mg by mouth daily.  04/09/21  Yes [provider]  lovastatin (MEVACOR) 40 MG tablet Take 1 tablet by mouth at bedtime. 05/15/15  Yes [provider]  potassium chloride (KLOR-CON) 10 MEQ tablet Take 10 mEq by mouth daily. 05/01/21  Yes [provider]  atenolol (TENORMIN) 100 MG tablet Take 1 tablet (100 mg total) by mouth daily. Patient not taking: Reported on 05/05/2021 06/18/15   Max Sane, MD  doxycycline (VIBRA-TABS) 100 MG tablet Take 1 tablet (100 mg total) by mouth every 12 (twelve) hours. Patient not taking: No sig reported 06/18/15   Max Sane, MD  ondansetron (ZOFRAN) 4 MG tablet Take 1 tablet (4 mg total) by mouth every 6 (six) hours as needed for nausea. Patient not taking: Reported on 05/05/2021 06/16/15   Theodoro Grist, MD     ALLERGIES:  Allergies  Allergen Reactions   Caduet [Amlodipine-Atorvastatin]      SOCIAL HISTORY:  Social History   Socioeconomic History   Marital status: Married    Spouse name: Not on file   Number of children: 2   Years of education: Not on file   Highest education level: High school graduate  Occupational History    Employer: [employer]  Tobacco Use   Smoking status: Current Every Day Smoker    Packs/day: 1.00   Smokeless tobacco: Never Used  Substance and Sexual Activity   Alcohol use: No   Drug use: Not on file   Sexual activity: Yes  Other Topics Concern   Not on file  Social History Narrative   Not on file   Social Determinants of Health  Financial Resource Strain: Not on file  Food Insecurity: Not on file  Transportation Needs: Not on file  Physical Activity: Not on file  Stress: Not on file  Social Connections: Not on file  Intimate Partner Violence: Not on file     FAMILY HISTORY:  Family History  Problem Relation Age of Onset   Diabetes Other    Diabetes Mother    Breast cancer Mother 39   Diabetes Maternal Grandmother    Breast cancer Maternal Grandmother       REVIEW OF SYSTEMS:  Review of  Systems  Constitutional: Positive for malaise/fatigue. Negative for chills and fever.  HENT: Negative for congestion and sore throat.   Respiratory: Positive for shortness of breath. Negative for cough.   Cardiovascular: Negative for chest pain and palpitations.  Gastrointestinal: Positive for nausea and vomiting. Negative for abdominal pain, blood in stool, constipation and diarrhea.  Genitourinary: Negative for dysuria and urgency.  Neurological: Positive for weakness (Generalized). Negative for dizziness.  All other systems reviewed and are negative.   VITAL SIGNS:  Temp:  [98 F (36.7 C)-99.3 F (37.4 C)] 98 F (36.7 C) (05/10 0523) Pulse Rate:  [75-111] 89 (05/10 0523) Resp:  [16-23] 18 (05/10 0523) BP: (79-112)/(47-74) 98/51 (05/10 0523) SpO2:  [89 %-97 %] 90 % (05/10 0523) Weight:  [102 kg-104 kg] 104 kg (05/10 0200)     Height: 5' 3"  (160 cm) Weight: 104 kg BMI (Calculated): 40.63   INTAKE/OUTPUT:  05/09 0701 - 05/10 0700 In: 4425.8 [I.V.:737; IV Piggyback:3688.8] Out: -   PHYSICAL EXAM:  Physical Exam Vitals and nursing note reviewed. Exam conducted with a chaperone present.  Constitutional:      General: She is not in acute distress.    Appearance: Normal appearance. She is not ill-appearing.  HENT:     Head: Normocephalic and atraumatic.  Eyes:     General: No scleral icterus.    Conjunctiva/sclera: Conjunctivae normal.  Cardiovascular:     Rate and Rhythm: Normal rate.     Pulses: Normal pulses.  Pulmonary:     Effort: Pulmonary effort is normal. No respiratory distress.  Abdominal:     General: There is no distension.     Palpations: Abdomen is soft.     Tenderness: There is abdominal tenderness (Soreness) in the right upper quadrant. There is no guarding or rebound. Negative signs include Murphy's sign.     Comments: Abdomen is soft, discomfort in the RUQ, non-distended, no rebound.guarding. Negative Murphy's Sign   Genitourinary:    Comments:  Deferred Skin:    General: Skin is warm and dry.     Coloration: Skin is not jaundiced or pale.     Findings: No erythema.  Neurological:     General: No focal deficit present.     Mental Status: She is alert and oriented to person, place, and time.  Psychiatric:        Mood and Affect: Mood normal.        Behavior: Behavior normal.      Labs:  CBC Latest Ref Rng & Units 05/06/2021 05/05/2021 06/16/2015  WBC 4.0 - 10.5 K/uL 9.5 13.0(H) 10.3  Hemoglobin 12.0 - 15.0 g/dL 13.5 15.1(H) 13.5  Hematocrit 36.0 - 46.0 % 39.9 44.8 40.4  Platelets 150 - 400 K/uL 129(L) 142(L) 126(L)   CMP Latest Ref Rng & Units 05/06/2021 05/05/2021 06/18/2015  Glucose 70 - 99 mg/dL 106(H) 202(H) -  BUN 6 - 20 mg/dL 13 19 -  Creatinine 0.44 -  1.00 mg/dL 0.58 0.90 -  Sodium 135 - 145 mmol/L 138 134(L) -  Potassium 3.5 - 5.1 mmol/L 3.7 2.9(L) 3.5  Chloride 98 - 111 mmol/L 102 94(L) -  CO2 22 - 32 mmol/L 29 25 -  Calcium 8.9 - 10.3 mg/dL 8.2(L) 8.7(L) -  Total Protein 6.5 - 8.1 g/dL - 7.0 -  Total Bilirubin 0.3 - 1.2 mg/dL - 4.3(H) -  Alkaline Phos 38 - 126 U/L - 72 -  AST 15 - 41 U/L - 277(H) -  ALT 0 - 44 U/L - 145(H) -    Imaging studies:   CT Abdomen/Pelvis (05/05/2021) personally reviewed which shows contracted gallbladder with stones, as well as significant lymphadenopathy, and radiologist report reviewed:  IMPRESSION: 1. No evidence of pulmonary embolism or acute cardiopulmonary disease. 2. Mild mediastinal, right hilar and upper abdominal adenopathy as described. Findings are nonspecific and may be due to neoplastic process such as leukemia/lymphoma or metastatic disease versus an infectious or inflammatory process. Recommend clinical correlation for primary neoplasm. 3. Moderate cholelithiasis. 4. Aortic atherosclerosis. Atherosclerotic coronary artery disease. Mild cardiomegaly.  RUQ [place] (05/05/2021) personally reviewed which shows cholelithiasis, some question of wall thickening, and  radiologist report reviewed below:  IMPRESSION: 1. Cholelithiasis with gallbladder wall thickening. Concern for a degree of acute cholecystitis. This finding may warrant nuclear medicine hepatobiliary imaging study to assess for cystic duct patency.   2. Increased liver echogenicity, a finding indicative of hepatic steatosis. No focal liver lesions evident by ultrasound. Note that the sensitivity of ultrasound for detection of focal liver lesions is diminished in this circumstance.   Assessment/Plan: (ICD-10's: K81.0) 60 y.o. female admitted with presumed sepsis thought to be secondary to cholecystitis. While this is possible, her gallbladder is contracted is difficult to truly say if there are gallbladder wall changes or peri-cholecystitic fluid. Her leukocytosis and lactic acidosis have resolved, and I wonder if there was a component of dehydration contributing to this. However, she does also continue to have hyperbilirubinemia with direct component, and I do believe it is pertinent to rule out obstruction with MRCP. I have ordered this. This may also be related to her malignant process given the degree of adenopathy on CT.    Recommend continue NPO for now + IVF resuscitation. Continue IV Abx (Zosyn). We will continue to follow closely and review MRCP. No definitive surgical intervention planned currently, but we will of course follow along.   All of the above findings and recommendations were discussed with the patient, and all of patient's questions were answered to her expressed satisfaction.  Thank you for the opportunity to participate in this patient's care.   -- Edison Simon, PA-C [facility] 05/06/2021, 6:45 AM [phone] M-F: 7am - 4pm  I saw and evaluated the patient.  I agree with the above documentation, exam, and plan, which I have edited where appropriate. Fredirick Maudlin  5:28 PM

## 2021-05-07 ENCOUNTER — Inpatient Hospital Stay: Payer: 59

## 2021-05-07 ENCOUNTER — Encounter: Payer: Self-pay | Admitting: Internal Medicine

## 2021-05-07 ENCOUNTER — Other Ambulatory Visit: Payer: Self-pay | Admitting: Radiology

## 2021-05-07 LAB — COMPREHENSIVE METABOLIC PANEL
ALT: 79 U/L — ABNORMAL HIGH (ref 0–44)
AST: 102 U/L — ABNORMAL HIGH (ref 15–41)
Albumin: 3 g/dL — ABNORMAL LOW (ref 3.5–5.0)
Alkaline Phosphatase: 68 U/L (ref 38–126)
Anion gap: 9 (ref 5–15)
BUN: 11 mg/dL (ref 6–20)
CO2: 29 mmol/L (ref 22–32)
Calcium: 8.4 mg/dL — ABNORMAL LOW (ref 8.9–10.3)
Chloride: 100 mmol/L (ref 98–111)
Creatinine, Ser: 0.6 mg/dL (ref 0.44–1.00)
GFR, Estimated: >60 mL/min (ref >60)
Glucose, Bld: 84 mg/dL (ref 70–99)
Potassium: 3.6 mmol/L (ref 3.5–5.1)
Sodium: 138 mmol/L (ref 135–145)
Total Bilirubin: 2.5 mg/dL — ABNORMAL HIGH (ref 0.3–1.2)
Total Protein: 5.9 g/dL — ABNORMAL LOW (ref 6.5–8.1)

## 2021-05-07 LAB — CBC WITH DIFFERENTIAL/PLATELET
Abs Immature Granulocytes: 0.03 10*3/uL (ref 0.00–0.07)
Basophils Absolute: 0 10*3/uL (ref 0.0–0.1)
Basophils Relative: 0 %
Eosinophils Absolute: 0.2 10*3/uL (ref 0.0–0.5)
Eosinophils Relative: 2 %
HCT: 41.2 % (ref 36.0–46.0)
Hemoglobin: 13.4 g/dL (ref 12.0–15.0)
Immature Granulocytes: 0 %
Lymphocytes Relative: 15 %
Lymphs Abs: 1.1 10*3/uL (ref 0.7–4.0)
MCH: 31.2 pg (ref 26.0–34.0)
MCHC: 32.5 g/dL (ref 30.0–36.0)
MCV: 95.8 fL (ref 80.0–100.0)
Monocytes Absolute: 0.4 10*3/uL (ref 0.1–1.0)
Monocytes Relative: 5 %
Neutro Abs: 5.8 10*3/uL (ref 1.7–7.7)
Neutrophils Relative %: 78 %
Platelets: 124 10*3/uL — ABNORMAL LOW (ref 150–400)
RBC: 4.3 MIL/uL (ref 3.87–5.11)
RDW: 14.5 % (ref 11.5–15.5)
WBC: 7.6 10*3/uL (ref 4.0–10.5)
nRBC: 0 % (ref 0.0–0.2)

## 2021-05-07 LAB — GLUCOSE, CAPILLARY
Glucose-Capillary: 84 mg/dL (ref 70–99)
Glucose-Capillary: 71 mg/dL (ref 70–99)
Glucose-Capillary: 103 mg/dL — ABNORMAL HIGH (ref 70–99)
Glucose-Capillary: 150 mg/dL — ABNORMAL HIGH (ref 70–99)

## 2021-05-07 LAB — PROCALCITONIN: Procalcitonin: 2.64 ng/mL

## 2021-05-07 LAB — PATHOLOGIST SMEAR REVIEW

## 2021-05-07 LAB — URINE CULTURE

## 2021-05-07 LAB — LACTATE DEHYDROGENASE: LDH: 104 U/L (ref 98–192)

## 2021-05-07 MED ORDER — ENOXAPARIN SODIUM 60 MG/0.6ML IJ SOSY
0.5000 mg/kg | PREFILLED_SYRINGE | INTRAMUSCULAR | Status: DC
  Administered 2021-05-07 – 2021-05-08 (×2): 52.5 mg via SUBCUTANEOUS
  Filled 2021-05-07 (×3): qty 0.53

## 2021-05-07 MED ORDER — OXYCODONE HCL 5 MG PO TABS
5.0000 mg | ORAL_TABLET | ORAL | Status: DC | PRN
  Administered 2021-05-07 – 2021-05-09 (×4): 5 mg via ORAL
  Filled 2021-05-07 (×4): qty 1

## 2021-05-07 MED ORDER — LACTATED RINGERS IV SOLN: INTRAVENOUS | Status: AC

## 2021-05-07 MED ORDER — LOPERAMIDE HCL 2 MG PO CAPS
2.0000 mg | ORAL_CAPSULE | Freq: Two times a day (BID) | ORAL | Status: DC | PRN
  Administered 2021-05-07: 2 mg via ORAL
  Filled 2021-05-07: qty 1

## 2021-05-07 MED ORDER — TECHNETIUM TC 99M MEBROFENIN IV KIT
6.9900 | PACK | Freq: Once | INTRAVENOUS | Status: AC | PRN
  Administered 2021-05-07: 6.99 via INTRAVENOUS

## 2021-05-07 NOTE — Progress Notes (Signed)
PHARMACIST - PHYSICIAN COMMUNICATION  CONCERNING:  Enoxaparin (Lovenox) for DVT Prophylaxis    RECOMMENDATION: Patient was prescribed enoxaparin 40 mg q24 hours for VTE prophylaxis.   Filed Weights   05/05/21 2121 05/06/21 0200 05/06/21 0714  Weight: 104 kg (229 lb 4.5 oz) 104 kg (229 lb 4.5 oz) 103.5 kg (228 lb 1.6 oz)    Body mass index is 40.41 kg/m.  Estimated Creatinine Clearance: 85.9 mL/min (by C-G formula based on SCr of 0.6 mg/dL).   Based on [facility] policy patient is candidate for enoxaparin 0.5mg /kg TBW SQ every 24 hours based on BMI > 30.   DESCRIPTION: Pharmacy has adjusted enoxaparin dose per [facility] policy.  Patient is now receiving enoxaparin 52.5 mg every 24 hours    Pricilla Riffle, PharmD Clinical Pharmacist  05/07/2021 12:08 PM

## 2021-05-07 NOTE — Progress Notes (Addendum)
PROGRESS NOTE    Danielle Franklin  AOZ:[id number] DOB: 01-16-1961 DOA: 05/05/2021 PCP: Gracelyn Nurse, MD   Brief Narrative:  60 y.o. female with medical history significant of hypertension, hyperlipidemia, diabetes mellitus, hypothyroidism, tobacco abuse, tickborne fever, who presented with nausea, vomiting, weakness.  Patient states that she has not been feeling well for almost a week.  She has nausea and had a multiple times of nonbilious nonbloody vomiting.  No diarrhea, denies abdominal pain.  She she had chills, but no fever at home.  No symptoms of UTI.  Patient has a chronic mild shortness of breath and in mild withdrawal coughing which she attributes to smoking, no significant change.  Chest CT showed no evidence of pulmonary embolism however did show significant hilar, mediastinal adenopathy concerning for possible malignancy.  Results of abdominal CT were negative for cholecystitis however follow-up ultrasound did show some evidence of gallbladder wall thickening.  General surgery consulted.   Assessment & Plan:   Severe sepsis Suspected cholecystitis, Abnormal LFTs On admission had nausea vomiting associated with leukocytosis, tachycardia tachypnea, lactic acidosis, sepsis has resolved -Improving now, started on bowel rest, IV Zosyn, IV fluids for suspected cholecystitis -CT abdomen pelvis and MRCP concerning for cholecystitis -General surgery following, HIDA scan ordered -Remains n.p.o., continue IV Zosyn and IV fluids today -Labs in a.m.  Hilar, mediastinal adenopathy CTA showed mild mediastinal, right hilar and upper abdominal adenopathy -Given her smoking history, malignancy is a consideration -Radiologist raise concern for lymphoma, will check LDH and peripheral smear -Will need oncology referral at discharge  DM 2 -CBGs are stable, hold glimepiride, sliding scale insulin  Hypothyroidism -change home Armour to IV Synthroid, decrease dose to from 90 to 50 mcg  daily -Can transition to p.o. once diet is advanced  Hypokalemia and hypomagnesemia -replaced  Tobacco abuse -Nicotine patch -Per documentation patient has been smoking since age 48 without plans to quit  DVT prophylaxis: Add Lovenox Code Status: Full Family Communication: Discussed with patient in detail, no family at bedside Disposition Plan: Status is: Inpatient  Remains inpatient appropriate because:Inpatient level of care appropriate due to severity of illness   Dispo: The patient is from: Home              Anticipated d/c is to: Home              Patient currently is not medically stable to d/c.   Difficult to place patient No   Level of care: Progressive Cardiac  Consultants:   General surgery  Procedures:   None  Antimicrobials:  Zosyn   Subjective: -Uncomfortable, wants to eat, had nausea and earlier, no abdominal pain today  Objective: Vitals:   05/07/21 0400 05/07/21 0600 05/07/21 0743 05/07/21 1123  BP: (!) 108/52 105/63 108/74 (!) 96/51  Pulse: 73 65 79 66  Resp: Temp:   98.5 F (36.9 C) (!) 97.4 F (36.3 C)  TempSrc:   Oral Oral  SpO2: 93% 91% 93% 90%  Weight:      Height:        Intake/Output Summary (Last 24 hours) at 05/07/2021 1127 Last data filed at 05/07/2021 1124 Gross per 24 hour  Intake 2901.28 ml  Output 1200 ml  Net 1701.28 ml   Filed Weights   05/05/21 2121 05/06/21 0200 05/06/21 0714  Weight: 104 kg 104 kg 103.5 kg    Examination:  General exam: Obese chronically ill pleasant female, sitting up at the edge of the bed, AAOx3,  uncomfortable appearing HEENT: Neck obese, unable to assess JVD, no icterus CVS: S1-S2, regular rate rhythm Lungs: Decreased breath sounds to bases, otherwise clear Abdomen: Soft, mildly distended, mild right-sided and epigastric tenderness, bowel sounds present Extremities: No edema Skin: No rashes on exposed skin Psychiatry: Mood & affect appropriate.     Data Reviewed: I  have personally reviewed following labs and imaging studies  CBC: Recent Labs  Lab 05/05/21 1113 05/06/21 0514 05/07/21 0423  WBC 13.0* 9.5 7.6  NEUTROABS 11.5*  --  5.8  HGB 15.1* 13.5 13.4  HCT 44.8 39.9 41.2  MCV 91.6 92.8 95.8  PLT 142* 129* 124*   Basic Metabolic Panel: Recent Labs  Lab 05/05/21 1113 05/05/21 1139 05/06/21 0514 05/07/21 0423  NA 134*  --  138 138  K 2.9*  --  3.7 3.6  CL 94*  --  102 100  CO2 25  --  29 29  GLUCOSE 202*  --  106* 84  BUN 19  --  13 11  CREATININE 0.90  --  0.58 0.60  CALCIUM 8.7*  --  8.2* 8.4*  MG 1.5*  --  2.2  --   PHOS  --  3.6  --   --    GFR: Estimated Creatinine Clearance: 85.9 mL/min (by C-G formula based on SCr of 0.6 mg/dL). Liver Function Tests: Recent Labs  Lab 05/05/21 1113 05/06/21 0929 05/07/21 0423  AST 277* 137* 102*  ALT 145* 96* 79*  ALKPHOS 72 59 68  BILITOT 4.3* 3.9* 2.5*  PROT 7.0 6.3* 5.9*  ALBUMIN 3.5 3.0* 3.0*   Recent Labs  Lab 05/05/21 1423 05/06/21 0929  LIPASE 243* 65*   No results for input(s): AMMONIA in the last 168 hours. Coagulation Profile: Recent Labs  Lab 05/05/21 1113  INR 1.1   Cardiac Enzymes: Recent Labs  Lab 05/05/21 1113  CKTOTAL 62   BNP (last 3 results) No results for input(s): PROBNP in the last 8760 hours. HbA1C: No results for input(s): HGBA1C in the last 72 hours. CBG: Recent Labs  Lab 05/06/21 0752 05/06/21 1154 05/06/21 1631 05/06/21 2021 05/07/21 0758  GLUCAP 111* 109* 106* 84 84   Lipid Profile: No results for input(s): CHOL, HDL, LDLCALC, TRIG, CHOLHDL, LDLDIRECT in the last 72 hours. Thyroid Function Tests: No results for input(s): TSH, T4TOTAL, FREET4, T3FREE, THYROIDAB in the last 72 hours. Anemia Panel: No results for input(s): VITAMINB12, FOLATE, FERRITIN, TIBC, IRON, RETICCTPCT in the last 72 hours. Sepsis Labs: Recent Labs  Lab 05/05/21 1331 05/05/21 1551 05/05/21 1757 05/06/21 0929 05/06/21 1236 05/07/21 0423  PROCALCITON   --  14.52  --  4.59  --  2.64  LATICACIDVEN 4.1*  --  2.0* 1.2 1.2  --     Recent Results (from the past 240 hour(s))  Urine culture     Status: Abnormal   Collection Time: 05/05/21 11:12 AM   Specimen: In/Out Cath Urine  Result Value Ref Range Status   Specimen Description   Final    IN/OUT CATH URINE Performed at [facility], [address] [place], [place] [address]    Special Requests   Final    NONE Performed at [facility], [address] [place], [place] [address]    Culture MULTIPLE SPECIES PRESENT, SUGGEST RECOLLECTION (A)  Final   Report Status 05/07/2021 FINAL  Final  Resp Panel by RT-PCR (Flu A&B, Covid) Nasopharyngeal Swab     Status: None   Collection Time: 05/05/21 11:13 AM  Specimen: Nasopharyngeal Swab; Nasopharyngeal(NP) swabs in vial transport medium  Result Value Ref Range Status   SARS Coronavirus 2 by RT PCR NEGATIVE NEGATIVE Final    Comment: (NOTE) SARS-CoV-2 target nucleic acids are NOT DETECTED.  The SARS-CoV-2 RNA is generally detectable in upper respiratory specimens during the acute phase of infection. The lowest concentration of SARS-CoV-2 viral copies this assay can detect is 138 copies/mL. A negative result does not preclude SARS-Cov-2 infection and should not be used as the sole basis for treatment or other patient management decisions. A negative result may occur with  improper specimen collection/handling, submission of specimen other than nasopharyngeal swab, presence of viral mutation(s) within the areas targeted by this assay, and inadequate number of viral copies(<138 copies/mL). A negative result must be combined with clinical observations, patient history, and epidemiological information. The expected result is Negative.  Fact Sheet for Patients:  BloggerCourse.com  Fact Sheet for Healthcare Providers:  SeriousBroker.it  This test is no t yet approved or  cleared by the [place] FDA and  has been authorized for detection and/or diagnosis of SARS-CoV-2 by FDA under an Emergency Use Authorization (EUA). This EUA will remain  in effect (meaning this test can be used) for the duration of the COVID-19 declaration under Section 564(b)(1) of the Act, 21 U.S.C.section 360bbb-3(b)(1), unless the authorization is terminated  or revoked sooner.       Influenza A by PCR NEGATIVE NEGATIVE Final   Influenza B by PCR NEGATIVE NEGATIVE Final    Comment: (NOTE) The Xpert Xpress SARS-CoV-2/FLU/RSV plus assay is intended as an aid in the diagnosis of influenza from Nasopharyngeal swab specimens and should not be used as a sole basis for treatment. Nasal washings and aspirates are unacceptable for Xpert Xpress SARS-CoV-2/FLU/RSV testing.  Fact Sheet for Patients: BloggerCourse.com  Fact Sheet for Healthcare Providers: SeriousBroker.it  This test is not yet approved or cleared by the [place] FDA and has been authorized for detection and/or diagnosis of SARS-CoV-2 by FDA under an Emergency Use Authorization (EUA). This EUA will remain in effect (meaning this test can be used) for the duration of the COVID-19 declaration under Section 564(b)(1) of the Act, 21 U.S.C. section 360bbb-3(b)(1), unless the authorization is terminated or revoked.  Performed at [facility], [address] [place], [place] [address]   Blood culture (routine single)     Status: None (Preliminary result)   Collection Time: 05/05/21 11:13 AM   Specimen: BLOOD  Result Value Ref Range Status   Specimen Description BLOOD LAC  Final   Special Requests   Final    BOTTLES DRAWN AEROBIC AND ANAEROBIC Blood Culture results may not be optimal due to an excessive volume of blood received in culture bottles   Culture   Final    NO GROWTH 2 DAYS Performed at [facility], [address], [place],  [place] [address]    Report Status PENDING  Incomplete  Culture, blood (Routine X 2) w Reflex to ID Panel     Status: None (Preliminary result)   Collection Time: 05/05/21 12:30 PM   Specimen: BLOOD  Result Value Ref Range Status   Specimen Description BLOOD BLOOD LEFT HAND  Final   Special Requests   Final    BOTTLES DRAWN AEROBIC AND ANAEROBIC Blood Culture results may not be optimal due to an excessive volume of blood received in culture bottles   Culture   Final    NO GROWTH 2 DAYS Performed at [facility], [address], [place], [place] [address]    Report Status PENDING  Incomplete         Radiology Studies: CT Angio Chest PE W and/or Wo Contrast  Result Date: 05/05/2021 CLINICAL DATA:  Just weakness and low back pain. Suspect pulmonary embolism. Abdominal pain. EXAM: CT ANGIOGRAPHY CHEST CT ABDOMEN AND PELVIS WITH CONTRAST TECHNIQUE: Multidetector CT imaging of the chest was performed using the standard protocol during bolus administration of intravenous contrast. Multiplanar CT image reconstructions and MIPs were obtained to evaluate the vascular anatomy. Multidetector CT imaging of the abdomen and pelvis was performed using the standard protocol during bolus administration of intravenous contrast. CONTRAST:  98mL OMNIPAQUE IOHEXOL 350 MG/ML SOLN COMPARISON:  None. FINDINGS: CTA CHEST FINDINGS Cardiovascular: Borderline cardiomegaly. Minimal calcified plaque over the left main and 3 vessel coronary arteries. Thoracic aorta is normal caliber. Minimal calcified plaque over the thoracic aorta. Pulmonary arterial system is well opacified without evidence of emboli. Mediastinum/Nodes: Increased number of mediastinal lymph nodes with a few mildly enlarged mediastinal lymph nodes present. There is a 1.3 cm AP window lymph node. 1.1 cm 1.2 cm precarinal lymph node. 1.6 cm subcarinal lymph node. No left hilar adenopathy. 1.1 cm right hilar lymph node. 1 cm lymph node anterior to the distal  esophagus. Remaining mediastinal structures are unremarkable. Lungs/Pleura: Lungs are adequately inflated without evidence of effusion. Minimal linear density over the medial lingula and right middle lobe compatible with scarring/atelectasis. No focal airspace consolidation. Airways are normal. Musculoskeletal: Minimal degenerative change of the spine. Review of the MIP images confirms the above findings. CT ABDOMEN and PELVIS FINDINGS Hepatobiliary: Moderate cholelithiasis. Liver and biliary tree are normal. Pancreas: Normal. Spleen: Normal. Adrenals/Urinary Tract: Adrenal glands are normal. Kidneys are normal in size without hydronephrosis or nephrolithiasis. Ureters and bladder are normal. Stomach/Bowel: Stomach is normal. Prominent diverticulum over the convex surface of the duodenal C sweep. Remainder of small bowel is unremarkable. Appendix is not visualized. Colon is normal. Vascular/Lymphatic: Calcified plaque over the abdominal aorta which is normal in caliber. Mild adenopathy in the region of the celiac axis with 1.2 cm lymph node noted as well as over the porta hepatis. Mild adenopathy over the gastrohepatic ligament with increased number of subcentimeter nodes over the retrocrural space and periaortic region. Reproductive: Normal. Other: No free fluid or focal inflammatory change. Musculoskeletal: Mild degenerative change of the spine. Review of the MIP images confirms the above findings. IMPRESSION: 1. No evidence of pulmonary embolism or acute cardiopulmonary disease. 2. Mild mediastinal, right hilar and upper abdominal adenopathy as described. Findings are nonspecific and may be due to neoplastic process such as leukemia/lymphoma or metastatic disease versus an infectious or inflammatory process. Recommend clinical correlation for primary neoplasm. 3. Moderate cholelithiasis. 4. Aortic atherosclerosis. Atherosclerotic coronary artery disease. Mild cardiomegaly. Aortic Atherosclerosis (ICD10-I70.0).  Electronically Signed   By: Elberta Fortis M.D.   On: 05/05/2021 14:03   CT ABDOMEN PELVIS W CONTRAST  Result Date: 05/05/2021 CLINICAL DATA:  Just weakness and low back pain. Suspect pulmonary embolism. Abdominal pain. EXAM: CT ANGIOGRAPHY CHEST CT ABDOMEN AND PELVIS WITH CONTRAST TECHNIQUE: Multidetector CT imaging of the chest was performed using the standard protocol during bolus administration of intravenous contrast. Multiplanar CT image reconstructions and MIPs were obtained to evaluate the vascular anatomy. Multidetector CT imaging of the abdomen and pelvis was performed using the standard protocol during bolus administration of intravenous contrast. CONTRAST:  52mL OMNIPAQUE IOHEXOL 350 MG/ML SOLN COMPARISON:  None. FINDINGS: CTA CHEST  FINDINGS Cardiovascular: Borderline cardiomegaly. Minimal calcified plaque over the left main and 3 vessel coronary arteries. Thoracic aorta is normal caliber. Minimal calcified plaque over the thoracic aorta. Pulmonary arterial system is well opacified without evidence of emboli. Mediastinum/Nodes: Increased number of mediastinal lymph nodes with a few mildly enlarged mediastinal lymph nodes present. There is a 1.3 cm AP window lymph node. 1.1 cm 1.2 cm precarinal lymph node. 1.6 cm subcarinal lymph node. No left hilar adenopathy. 1.1 cm right hilar lymph node. 1 cm lymph node anterior to the distal esophagus. Remaining mediastinal structures are unremarkable. Lungs/Pleura: Lungs are adequately inflated without evidence of effusion. Minimal linear density over the medial lingula and right middle lobe compatible with scarring/atelectasis. No focal airspace consolidation. Airways are normal. Musculoskeletal: Minimal degenerative change of the spine. Review of the MIP images confirms the above findings. CT ABDOMEN and PELVIS FINDINGS Hepatobiliary: Moderate cholelithiasis. Liver and biliary tree are normal. Pancreas: Normal. Spleen: Normal. Adrenals/Urinary Tract: Adrenal  glands are normal. Kidneys are normal in size without hydronephrosis or nephrolithiasis. Ureters and bladder are normal. Stomach/Bowel: Stomach is normal. Prominent diverticulum over the convex surface of the duodenal C sweep. Remainder of small bowel is unremarkable. Appendix is not visualized. Colon is normal. Vascular/Lymphatic: Calcified plaque over the abdominal aorta which is normal in caliber. Mild adenopathy in the region of the celiac axis with 1.2 cm lymph node noted as well as over the porta hepatis. Mild adenopathy over the gastrohepatic ligament with increased number of subcentimeter nodes over the retrocrural space and periaortic region. Reproductive: Normal. Other: No free fluid or focal inflammatory change. Musculoskeletal: Mild degenerative change of the spine. Review of the MIP images confirms the above findings. IMPRESSION: 1. No evidence of pulmonary embolism or acute cardiopulmonary disease. 2. Mild mediastinal, right hilar and upper abdominal adenopathy as described. Findings are nonspecific and may be due to neoplastic process such as leukemia/lymphoma or metastatic disease versus an infectious or inflammatory process. Recommend clinical correlation for primary neoplasm. 3. Moderate cholelithiasis. 4. Aortic atherosclerosis. Atherosclerotic coronary artery disease. Mild cardiomegaly. Aortic Atherosclerosis (ICD10-I70.0). Electronically Signed   By: Elberta Fortis M.D.   On: 05/05/2021 14:03   MR ABDOMEN MRCP WO CONTRAST  Result Date: 05/06/2021 CLINICAL DATA:  Nausea vomiting and weakness. EXAM: MRI ABDOMEN WITHOUT CONTRAST  (INCLUDING MRCP) TECHNIQUE: Multiplanar multisequence MR imaging of the abdomen was performed. Heavily T2-weighted images of the biliary and pancreatic ducts were obtained, and three-dimensional MRCP images were rendered by post processing. COMPARISON:  CT scan 05/05/2021 FINDINGS: Motion degraded study. Lower chest: Unremarkable Hepatobiliary: Diffuse loss of signal  intensity in the liver parenchyma on out of phase T1 imaging is consistent with fatty deposition. No focal abnormality within the liver parenchyma on noncontrast imaging tiny layering gallstones noted in the gallbladder with gallbladder wall thickening and/or pericholecystic edema/fluid. Adenomyomatosis noted at the gallbladder fundus. No intra or extrahepatic biliary duct dilatation. MRCP images degraded by motion artifact, but no evidence of choledocholithiasis within this limitation. Common bile duct is nondilated in the head of pancreas measuring 3-4 mm diameter. Pancreas: No focal mass lesion. No dilatation of the main duct. No intraparenchymal cyst. No peripancreatic edema. Spleen:  No splenomegaly. No focal mass lesion. Adrenals/Urinary Tract: No adrenal nodule or mass. Kidneys unremarkable. Stomach/Bowel: Stomach is unremarkable. No gastric wall thickening. No evidence of outlet obstruction. Duodenum is normally positioned as is the ligament of Treitz. Duodenal diverticulum noted. No small bowel or colonic dilatation within the visualized abdomen. Diverticular changes are seen diffusely  along the abdominal segments of the colon. Vascular/Lymphatic: No abdominal aortic aneurysm. No abdominal lymphadenopathy Other: Probable mild retroperitoneal edema in the upper abdomen. Trace fluid noted in the pelvis on coronal T2 imaging. Musculoskeletal: No suspicious marrow signal abnormality within the visualized bony anatomy. IMPRESSION: 1. Motion degraded study. 2. Cholelithiasis with gallbladder wall thickening and/or pericholecystic edema/fluid. Acute cholecystitis a consideration. Right upper quadrant ultrasound yesterday documented gallbladder wall thickening. Nuclear scintigraphy could be used to assess for cystic duct obstruction as clinically warranted. 3. No intra or extrahepatic biliary duct dilatation. No evidence of choledocholithiasis. 4. Probable mild retroperitoneal edema in the upper abdomen with trace  fluid in the pelvis. Electronically Signed   By: Kennith Center M.D.   On: 05/06/2021 16:14   MR 3D Recon At Scanner  Result Date: 05/06/2021 CLINICAL DATA:  Nausea vomiting and weakness. EXAM: MRI ABDOMEN WITHOUT CONTRAST  (INCLUDING MRCP) TECHNIQUE: Multiplanar multisequence MR imaging of the abdomen was performed. Heavily T2-weighted images of the biliary and pancreatic ducts were obtained, and three-dimensional MRCP images were rendered by post processing. COMPARISON:  CT scan 05/05/2021 FINDINGS: Motion degraded study. Lower chest: Unremarkable Hepatobiliary: Diffuse loss of signal intensity in the liver parenchyma on out of phase T1 imaging is consistent with fatty deposition. No focal abnormality within the liver parenchyma on noncontrast imaging tiny layering gallstones noted in the gallbladder with gallbladder wall thickening and/or pericholecystic edema/fluid. Adenomyomatosis noted at the gallbladder fundus. No intra or extrahepatic biliary duct dilatation. MRCP images degraded by motion artifact, but no evidence of choledocholithiasis within this limitation. Common bile duct is nondilated in the head of pancreas measuring 3-4 mm diameter. Pancreas: No focal mass lesion. No dilatation of the main duct. No intraparenchymal cyst. No peripancreatic edema. Spleen:  No splenomegaly. No focal mass lesion. Adrenals/Urinary Tract: No adrenal nodule or mass. Kidneys unremarkable. Stomach/Bowel: Stomach is unremarkable. No gastric wall thickening. No evidence of outlet obstruction. Duodenum is normally positioned as is the ligament of Treitz. Duodenal diverticulum noted. No small bowel or colonic dilatation within the visualized abdomen. 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 Vascular/Lymphatic: No abdominal aortic aneurysm. No abdominal lymphadenopathy Other: Probable mild retroperitoneal edema in the upper abdomen. Trace fluid noted in the pelvis on coronal T2  imaging. Musculoskeletal: No suspicious marrow signal abnormality within the visualized bony anatomy. IMPRESSION: 1. Motion degraded study. 2. Cholelithiasis with gallbladder wall thickening and/or pericholecystic edema/fluid. Acute cholecystitis a consideration. Right upper quadrant ultrasound yesterday documented gallbladder wall thickening. Nuclear scintigraphy could be used to assess for cystic duct obstruction as clinically warranted. 3. No intra or extrahepatic biliary duct dilatation. No evidence of choledocholithiasis. 4. Probable mild retroperitoneal edema in the upper abdomen with trace fluid in the pelvis. Electronically Signed   By: Kennith Center M.D.   On: 05/06/2021 16:14   US Venous Img Lower Bilateral (DVT)  Result Date: 05/05/2021 CLINICAL DATA:  Positive D-dimer. EXAM: BILATERAL LOWER EXTREMITY VENOUS DOPPLER ULTRASOUND TECHNIQUE: Gray-scale sonography with graded compression, as well as color Doppler and duplex ultrasound were performed to evaluate the lower extremity deep venous systems from the level of the common femoral vein and including the common femoral, femoral, profunda femoral, popliteal and calf veins including the posterior tibial, peroneal and gastrocnemius veins when visible. The superficial great saphenous vein was also interrogated. Spectral Doppler was utilized to evaluate flow at rest and with distal augmentation maneuvers in the common femoral, femoral and popliteal veins. COMPARISON:  None. FINDINGS: RIGHT LOWER EXTREMITY  Common Femoral Vein: No evidence of thrombus. Normal compressibility, respiratory phasicity and response to augmentation. Saphenofemoral Junction: No evidence of thrombus. Normal compressibility and flow on color Doppler imaging. Profunda Femoral Vein: No evidence of thrombus. Normal compressibility and flow on color Doppler imaging. Femoral Vein: No evidence of thrombus. Normal compressibility, respiratory phasicity and response to augmentation. Popliteal  Vein: No evidence of thrombus. Normal compressibility, respiratory phasicity and response to augmentation. Calf Veins: No evidence of thrombus. Normal compressibility and flow on color Doppler imaging. Superficial Great Saphenous Vein: No evidence of thrombus. Normal compressibility. Venous Reflux:  None. Other Findings:  None. LEFT LOWER EXTREMITY Common Femoral Vein: No evidence of thrombus. Normal compressibility, respiratory phasicity and response to augmentation. Saphenofemoral Junction: No evidence of thrombus. Normal compressibility and flow on color Doppler imaging. Profunda Femoral Vein: No evidence of thrombus. Normal compressibility and flow on color Doppler imaging. Femoral Vein: No evidence of thrombus. Normal compressibility, respiratory phasicity and response to augmentation. Popliteal Vein: No evidence of thrombus. Normal compressibility, respiratory phasicity and response to augmentation. Calf Veins: No evidence of thrombus. Normal compressibility and flow on color Doppler imaging. Superficial Great Saphenous Vein: No evidence of thrombus. Normal compressibility. Venous Reflux:  None. Other Findings:  None. IMPRESSION: No evidence of deep venous thrombosis in either lower extremity. Electronically Signed   By: Elberta Fortisaniel  Boyle M.D.   On: 05/05/2021 18:25   DG Chest Port 1 View  Result Date: 05/05/2021 CLINICAL DATA:  Questionable sepsis EXAM: PORTABLE CHEST 1 VIEW COMPARISON:  06/15/2015 FINDINGS: Lingular opacity, favor atelectasis although infiltrate cannot be excluded. No confluent opacity on the right. Heart is normal size. No effusions or acute bony abnormality. IMPRESSION: Lingular atelectasis or infiltrate. Electronically Signed   By: Charlett NoseKevin  Dover M.D.   On: 05/05/2021 11:42   US ABDOMEN LIMITED RUQ (LIVER/GB)  Result Date: 05/05/2021 CLINICAL DATA:  Elevated liver enzymes EXAM: ULTRASOUND ABDOMEN LIMITED RIGHT UPPER QUADRANT COMPARISON:  CT abdomen and pelvis May 05, 2021 FINDINGS:  Gallbladder: Within the gallbladder, there are echogenic foci which move and shadow consistent with cholelithiasis. Largest gallstone measures 1.1 cm. There is gallbladder wall thickening. No appreciable pericholecystic fluid. No sonographic Murphy sign noted by sonographer. Common bile duct: Diameter: 4 mm. No intrahepatic or extrahepatic biliary duct dilatation. Liver: No focal lesion identified. Liver echogenicity is increased diffusely. Portal vein is patent on color Doppler imaging with normal direction of blood flow towards the liver. Other: None. IMPRESSION: 1. Cholelithiasis with gallbladder wall thickening. Concern for a degree of acute cholecystitis. This finding may warrant nuclear medicine hepatobiliary imaging study to assess for cystic duct patency. 2. Increased liver echogenicity, a finding indicative of hepatic steatosis. No focal liver lesions evident by ultrasound. Note that the sensitivity of ultrasound for detection of focal liver lesions is diminished in this circumstance. Electronically Signed   By: Bretta BangWilliam  Woodruff III M.D.   On: 05/05/2021 15:06        Scheduled Meds: . insulin aspart  0-5 Units Subcutaneous QHS  . insulin aspart  0-9 Units Subcutaneous TID WC  . mouth rinse  15 mL Mouth Rinse BID  . nicotine  21 mg Transdermal Daily  . pravastatin  40 mg Oral q1800  . thyroid  90 mg Oral QAC breakfast   Continuous Infusions: . lactated ringers 75 mL/hr at 05/07/21 0939  . piperacillin-tazobactam (ZOSYN)  IV 3.375 g (05/07/21 0541)     LOS: 2 days    Time spent: 25 minutes    Zannie CovePreetha Danitza Schoenfeldt,  MD [facility]5/10/2021, 11:27 AM

## 2021-05-08 LAB — CBC WITH DIFFERENTIAL/PLATELET
Abs Immature Granulocytes: 0.05 10*3/uL (ref 0.00–0.07)
Basophils Absolute: 0 10*3/uL (ref 0.0–0.1)
Basophils Relative: 1 %
Eosinophils Absolute: 0.2 10*3/uL (ref 0.0–0.5)
Eosinophils Relative: 3 %
HCT: 40.2 % (ref 36.0–46.0)
Hemoglobin: 13.2 g/dL (ref 12.0–15.0)
Immature Granulocytes: 1 %
Lymphocytes Relative: 16 %
Lymphs Abs: 0.9 10*3/uL (ref 0.7–4.0)
MCH: 31.1 pg (ref 26.0–34.0)
MCHC: 32.8 g/dL (ref 30.0–36.0)
MCV: 94.6 fL (ref 80.0–100.0)
Monocytes Absolute: 0.4 10*3/uL (ref 0.1–1.0)
Monocytes Relative: 6 %
Neutro Abs: 4.5 10*3/uL (ref 1.7–7.7)
Neutrophils Relative %: 73 %
Platelets: 138 10*3/uL — ABNORMAL LOW (ref 150–400)
RBC: 4.25 MIL/uL (ref 3.87–5.11)
RDW: 14.3 % (ref 11.5–15.5)
WBC: 6 10*3/uL (ref 4.0–10.5)
nRBC: 0 % (ref 0.0–0.2)

## 2021-05-08 LAB — GLUCOSE, CAPILLARY
Glucose-Capillary: 139 mg/dL — ABNORMAL HIGH (ref 70–99)
Glucose-Capillary: 124 mg/dL — ABNORMAL HIGH (ref 70–99)
Glucose-Capillary: 113 mg/dL — ABNORMAL HIGH (ref 70–99)

## 2021-05-08 LAB — COMPREHENSIVE METABOLIC PANEL
ALT: 63 U/L — ABNORMAL HIGH (ref 0–44)
AST: 76 U/L — ABNORMAL HIGH (ref 15–41)
Albumin: 2.9 g/dL — ABNORMAL LOW (ref 3.5–5.0)
Alkaline Phosphatase: 75 U/L (ref 38–126)
Anion gap: 9 (ref 5–15)
BUN: 7 mg/dL (ref 6–20)
CO2: 29 mmol/L (ref 22–32)
Calcium: 8.5 mg/dL — ABNORMAL LOW (ref 8.9–10.3)
Chloride: 101 mmol/L (ref 98–111)
Creatinine, Ser: 0.58 mg/dL (ref 0.44–1.00)
GFR, Estimated: >60 mL/min (ref >60)
Glucose, Bld: 119 mg/dL — ABNORMAL HIGH (ref 70–99)
Potassium: 3.7 mmol/L (ref 3.5–5.1)
Sodium: 139 mmol/L (ref 135–145)
Total Bilirubin: 1.6 mg/dL — ABNORMAL HIGH (ref 0.3–1.2)
Total Protein: 6.1 g/dL — ABNORMAL LOW (ref 6.5–8.1)

## 2021-05-08 LAB — PROCALCITONIN: Procalcitonin: 1.48 ng/mL

## 2021-05-08 MED ORDER — LIDOCAINE 5 % EX PTCH
1.0000 | MEDICATED_PATCH | CUTANEOUS | Status: DC
  Administered 2021-05-08 – 2021-05-09 (×2): 1 via TRANSDERMAL
  Filled 2021-05-08 (×2): qty 1

## 2021-05-08 NOTE — Consult Note (Signed)
[facility] SURGICAL CONSULTATION NOTE   HISTORY OF PRESENT ILLNESS (HPI):  60 y.o. female presented to [facility] ED yesterday for evaluation of generalized weakness and fatigue which had been worsening over the lst 3-4 days. She had relatively vague complaints of fatigue and SOB which seemed to be exacerbated with exertion. No fever, chills, nausea, or emesis. No previous intra-abdominal surgeries. Work up revealed a leukocytosis to 13.0K (now resolved at 9.0K), hypokalemia to 2.9 (resolved to 3.7), Hyperbilirubinemia to 4.3 (persistent to 3.9 with direct being 2.3), mild lipase elevated to 243 (now 65), and a lactic acidosis to 4.7K (now resolved at 1.2). She would undergo CTA for PE rule out which was negative. Work up also included CT Abdomen/Pelvis which was most concerning for mediastinal lymphadenopathy but there was cholelithiasis seen. She would have follow up RUQ [place] which was concerning for cholelithiasis and questionable wall thickening. She would ultimately be admitted to the medicine service for presumed sepsis. She is currently on Zosyn.    Surgery is consulted by emergency medicine physician Dr. Hulan Saas, MD in this context for evaluation and management of possible cholecystitis.  INTERVAL HISTORY 05/08/21: MRCP and HIDA scan both negative. Patient denies any abdominal pain. Is up walking with PT.   PAST MEDICAL HISTORY (PMH):  Past Medical History:  Diagnosis Date  . Diabetes mellitus without complication ([place])   . Hypertension   . Thyroid disease      PAST SURGICAL HISTORY ([place]):  Past Surgical History:  Procedure Laterality Date  . BREAST BIOPSY Left early 2000s   benign     MEDICATIONS:  Prior to Admission medications   Medication Sig Start Date End Date Taking? Authorizing Provider  ARMOUR THYROID 90 MG tablet Take 90 mg by mouth daily. 05/15/15  Yes [provider]  atenolol-chlorthalidone (TENORETIC) 50-25 MG tablet Take 1 tablet by mouth  daily.   Yes [provider]  glimepiride (AMARYL) 1 MG tablet Take 1 mg by mouth daily. 04/09/21  Yes [provider]  lovastatin (MEVACOR) 40 MG tablet Take 1 tablet by mouth at bedtime. 05/15/15  Yes [provider]  potassium chloride (KLOR-CON) 10 MEQ tablet Take 10 mEq by mouth daily. 05/01/21  Yes [provider]  atenolol (TENORMIN) 100 MG tablet Take 1 tablet (100 mg total) by mouth daily. Patient not taking: Reported on 05/05/2021 06/18/15   Max Sane, MD  doxycycline (VIBRA-TABS) 100 MG tablet Take 1 tablet (100 mg total) by mouth every 12 (twelve) hours. Patient not taking: No sig reported 06/18/15   Max Sane, MD  ondansetron (ZOFRAN) 4 MG tablet Take 1 tablet (4 mg total) by mouth every 6 (six) hours as needed for nausea. Patient not taking: Reported on 05/05/2021 06/16/15   Theodoro Grist, MD     ALLERGIES:  Allergies  Allergen Reactions  . Caduet [Amlodipine-Atorvastatin]      SOCIAL HISTORY:  Social History   Socioeconomic History  . Marital status: Married    Spouse name: Not on file  . Number of children: 2  . Years of education: Not on file  . Highest education level: High school graduate  Occupational History    Employer: [employer]  Tobacco Use  . Smoking status: Current Every Day Smoker    Packs/day: 1.00  . Smokeless tobacco: Never Used  Substance and Sexual Activity  . Alcohol use: No  . Drug use: Not on file  . Sexual activity: Yes  Other Topics Concern  . Not on file  Social History Narrative  . Not on file   Social Determinants of Health   Financial Resource Strain: Not on file  Food Insecurity: Not on file  Transportation Needs: Not on file  Physical Activity: Not on file  Stress: Not on file  Social Connections: Not on file  Intimate Partner Violence: Not on file     FAMILY HISTORY:  Family History  Problem Relation Age of Onset  . Diabetes Other   . Diabetes Mother   . Breast cancer Mother 63   . Diabetes Maternal Grandmother   . Breast cancer Maternal Grandmother       REVIEW OF SYSTEMS:  Review of Systems  Constitutional: Positive for malaise/fatigue. Negative for chills and fever.  HENT: Negative for congestion and sore throat.   Respiratory: Positive for shortness of breath. Negative for cough.   Cardiovascular: Negative for chest pain and palpitations.  Gastrointestinal: Negative for abdominal pain, blood in stool, constipation, diarrhea, nausea and vomiting.  Genitourinary: Negative for dysuria and urgency.  Neurological: Positive for weakness (Generalized). Negative for dizziness.  All other systems reviewed and are negative.   VITAL SIGNS:  Temp:  [97.4 F (36.3 C)-98.6 F (37 C)] 98 F (36.7 C) (05/12 1015) Pulse Rate:  [66-76] 74 (05/12 1015) Resp:  [18-20] 18 (05/12 1015) BP: (96-115)/(51-71) 111/71 (05/12 1015) SpO2:  [82 %-95 %] 91 % (05/12 1015) Weight:  [105 kg] 105 kg (05/11 1222)     Height: 5' 3" (160 cm) Weight: 105 kg BMI (Calculated): 41   INTAKE/OUTPUT:  05/11 0701 - 05/12 0700 In: 1610 [P.O.:820; I.V.:399.5; IV Piggyback:142.5] Out: 1100 [Urine:1100]  PHYSICAL EXAM:  Physical Exam Vitals and nursing note reviewed. Exam conducted with a chaperone present.  Constitutional:      General: She is not in acute distress.    Appearance: Normal appearance. She is not ill-appearing.  HENT:     Head: Normocephalic and atraumatic.  Eyes:     General: No scleral icterus.    Conjunctiva/sclera: Conjunctivae normal.  Cardiovascular:     Rate and Rhythm: Normal rate.     Pulses: Normal pulses.  Pulmonary:     Effort: Pulmonary effort is normal. No respiratory distress.  Abdominal:     General: There is no distension.     Palpations: Abdomen is soft.     Tenderness: There is no abdominal tenderness (Soreness). There is no guarding or rebound. Negative signs include Murphy's sign.     Comments:    Genitourinary:    Comments: Deferred Skin:     General: Skin is warm and dry.     Coloration: Skin is not jaundiced or pale.     Findings: No erythema.  Neurological:     General: No focal deficit present.     Mental Status: She is alert and oriented to person, place, and time.  Psychiatric:        Mood and Affect: Mood normal.        Behavior: Behavior normal.      Labs:  CBC Latest Ref Rng & Units 05/08/2021 05/07/2021 05/06/2021  WBC 4.0 - 10.5 K/uL 6.0 7.6 9.5  Hemoglobin 12.0 - 15.0 g/dL 13.2 13.4 13.5  Hematocrit 36.0 - 46.0 % 40.2 41.2 39.9  Platelets 150 - 400 K/uL 138(L) 124(L) 129(L)   CMP Latest Ref Rng & Units 05/08/2021 05/07/2021 05/06/2021  Glucose 70 - 99 mg/dL 119(H) 84 106(H)  BUN 6 - 20 mg/dL _0 Creatinine 0.44 - 1.00 mg/dL  0.58 0.60 0.58  Sodium 135 - 145 mmol/L 139 138 138  Potassium 3.5 - 5.1 mmol/L 3.7 3.6 3.7  Chloride 98 - 111 mmol/L 101 100 102  CO2 22 - 32 mmol/L _0 Calcium 8.9 - 10.3 mg/dL 8.5(L) 8.4(L) 8.2(L)  Total Protein 6.5 - 8.1 g/dL 6.1(L) 5.9(L) 6.3(L)  Total Bilirubin 0.3 - 1.2 mg/dL 1.6(H) 2.5(H) 3.9(H)  Alkaline Phos 38 - 126 U/L 75 68 59  AST 15 - 41 U/L 76(H) 102(H) 137(H)  ALT 0 - 44 U/L 63(H) 79(H) 96(H)    Imaging studies:   CT Abdomen/Pelvis (05/05/2021) personally reviewed which shows contracted gallbladder with stones, as well as significant lymphadenopathy, and radiologist report reviewed:  IMPRESSION: 1. No evidence of pulmonary embolism or acute cardiopulmonary disease. 2. Mild mediastinal, right hilar and upper abdominal adenopathy as described. Findings are nonspecific and may be due to neoplastic process such as leukemia/lymphoma or metastatic disease versus an infectious or inflammatory process. Recommend clinical correlation for primary neoplasm. 3. Moderate cholelithiasis. 4. Aortic atherosclerosis. Atherosclerotic coronary artery disease. Mild cardiomegaly.  RUQ [place] (05/05/2021) personally reviewed which shows cholelithiasis, some question of wall  thickening, and radiologist report reviewed below:  IMPRESSION: 1. Cholelithiasis with gallbladder wall thickening. Concern for a degree of acute cholecystitis. This finding may warrant nuclear medicine hepatobiliary imaging study to assess for cystic duct patency.   2. Increased liver echogenicity, a finding indicative of hepatic steatosis. No focal liver lesions evident by ultrasound. Note that the sensitivity of ultrasound for detection of focal liver lesions is diminished in this circumstance.  MRCP 05/06/21: CLINICAL DATA:  Nausea vomiting and weakness.  EXAM: MRI ABDOMEN WITHOUT CONTRAST  (INCLUDING MRCP)  TECHNIQUE: Multiplanar multisequence MR imaging of the abdomen was performed. Heavily T2-weighted images of the biliary and pancreatic ducts were obtained, and three-dimensional MRCP images were rendered by post processing.  COMPARISON:  CT scan 05/05/2021  FINDINGS: Motion degraded study.  Lower chest: Unremarkable  Hepatobiliary: Diffuse loss of signal intensity in the liver parenchyma on out of phase T1 imaging is consistent with fatty deposition. No focal abnormality within the liver parenchyma on noncontrast imaging tiny layering gallstones noted in the gallbladder with gallbladder wall thickening and/or pericholecystic edema/fluid. Adenomyomatosis noted at the gallbladder fundus. No intra or extrahepatic biliary duct dilatation. MRCP images degraded by motion artifact, but no evidence of choledocholithiasis within this limitation. Common bile duct is nondilated in the head of pancreas measuring 3-4 mm diameter.  Pancreas: No focal mass lesion. No dilatation of the main duct. No intraparenchymal cyst. No peripancreatic edema.  Spleen:  No splenomegaly. No focal mass lesion.  Adrenals/Urinary Tract: No adrenal nodule or mass. Kidneys unremarkable.  Stomach/Bowel: Stomach is unremarkable. No gastric wall thickening. No evidence of outlet  obstruction. Duodenum is normally positioned as is the ligament of Treitz. Duodenal diverticulum noted. No small bowel or colonic dilatation within the visualized abdomen. 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  Vascular/Lymphatic: No abdominal aortic aneurysm. No abdominal lymphadenopathy  Other: Probable mild retroperitoneal edema in the upper abdomen. Trace fluid noted in the pelvis on coronal T2 imaging.  Musculoskeletal: No suspicious marrow signal abnormality within the visualized bony anatomy.  IMPRESSION: 1. Motion degraded study. 2. Cholelithiasis with gallbladder wall thickening and/or pericholecystic edema/fluid. Acute cholecystitis a consideration. Right upper quadrant ultrasound yesterday documented gallbladder wall thickening. Nuclear scintigraphy could be used to assess for cystic duct obstruction as clinically warranted. 3. No intra or extrahepatic biliary  duct dilatation. No evidence of choledocholithiasis. 4. Probable mild retroperitoneal edema in the upper abdomen with trace fluid in the pelvis.  HIDA scan 05/07/21: CLINICAL DATA:  History of abnormal ultrasound and CT suggestive of acute cholecystitis, cholelithiasis  EXAM: NUCLEAR MEDICINE HEPATOBILIARY IMAGING  TECHNIQUE: Sequential images of the abdomen were obtained out to 60 minutes following intravenous administration of radiopharmaceutical.  RADIOPHARMACEUTICALS:  6.99 mCi Tc-73m Choletec IV  COMPARISON:  CT and ultrasound from 05/05/2021 and MRI from 05/06/2021  FINDINGS: Prompt uptake and biliary excretion of activity by the liver is seen. Gallbladder activity is visualized, consistent with patency of cystic duct. Biliary activity passes into small bowel, consistent with patent common bile duct.  IMPRESSION: Normal uptake and excretion of biliary tracer. The cystic duct is Patent.  Assessment/Plan: (ICD-10's: K81.0) 60y.o. female  admitted with presumed sepsis thought to be secondary to cholecystitis. While this is possible, her gallbladder is contracted is difficult to truly say if there are gallbladder wall changes or peri-cholecystitic fluid. Her leukocytosis and lactic acidosis have resolved, and I wonder if there was a component of dehydration contributing to this. MRCP and HIDA scan are negative for biliary obstruction and cholecystitis is essentially ruled out. Would continue to evaluate for other etiologies of patient illness, particularly the lymphadenopathy seen on imaging.   -No indication for cholecystectomy.  -No current surgical needs; general surgery will sign off.

## 2021-05-08 NOTE — TOC Progression Note (Signed)
Transition of Care [facility]) - Progression Note    Patient Details  Name: Danielle Franklin MRN: [id number] Date of Birth: 10/22/1961  Transition of [facility]) CM/SW Contact  Gildardo Griffes, [place] Phone Number: 05/08/2021, 2:16 PM  Clinical Narrative:     CSW acknowledges consult for financial counselor to see patient. CSW has reached out to [employer] with financial counseling to please see patient.        Expected Discharge Plan and Services                                                 Social Determinants of Health (SDOH) Interventions    Readmission Risk Interventions No flowsheet data found.

## 2021-05-08 NOTE — Progress Notes (Signed)
PROGRESS NOTE    Danielle Franklin  PPJ:[id number] DOB: 1961-06-03 DOA: 05/05/2021 PCP: Gracelyn Nurse, MD   Brief Narrative:  60 y.o. female with medical history significant of hypertension, hyperlipidemia, diabetes mellitus, hypothyroidism, tobacco abuse, tickborne fever, who presented with nausea, vomiting, weakness.  Patient states that she has not been feeling well for almost a week.  She has nausea and had a multiple times of nonbilious nonbloody vomiting.  She she had chills, but no fever at home.  No symptoms of UTI.  Patient has a chronic mild shortness of breath and in mild withdrawal coughing which she attributes to smoking, no significant change.  Chest CT showed no evidence of pulmonary embolism however did show significant hilar, mediastinal adenopathy concerning for possible malignancy.  RUQ ultrasound did show some evidence of gallbladder wall thickening.  General surgery consulted.   Assessment & Plan:   Severe sepsis Suspected cholecystitis, Abnormal LFTs On admission had nausea vomiting associated with leukocytosis, tachycardia tachypnea, lactic acidosis, sepsis has resolved -Improving now, started on bowel rest, IV Zosyn, IV fluids for suspected cholecystitis -CT abdomen pelvis and MRCP concerning for cholecystitis -General surgery following, HIDA scan noted patent cystic duct, rules out acute cholecystitis -Could have been gastroenteritis  -Advance diet, discontinue Zosyn today, supportive care and monitor -All cultures are negative and afebrile, nontoxic  Hilar, mediastinal adenopathy CTA showed mild mediastinal, right hilar and upper abdominal adenopathy -Given her smoking history, malignancy is a consideration -Radiologist raise concern for lymphoma, LDH is normal, peripheral smear unremarkable except for mild thrombocytopenia -Will need oncology referral at discharge  Low back pain, symptoms of sciatica -No acute etiology noted on CT abdomen pelvis and  MRCP -Trial of lidocaine patch, Flexeril, Vicodin -Ambulate, physical therapy  DM 2 -CBGs are stable, hold glimepiride, sliding scale insulin  Hypothyroidism -change home Armour to IV Synthroid, decrease dose to from 90 to 50 mcg daily -Can transition to p.o. once diet is advanced  Hypokalemia and hypomagnesemia -replaced  Tobacco abuse -Nicotine patch -Per documentation patient has been smoking since age 14 without plans to quit  DVT prophylaxis: Lovenox Code Status: Full Family Communication: Discussed with patient in detail, no family at bedside Disposition Plan: Status is: Inpatient  Remains inpatient appropriate because:Inpatient level of care appropriate due to severity of illness   Dispo: The patient is from: Home              Anticipated d/c is to: Home tomorrow if stable              Patient currently is not medically stable to d/c.   Difficult to place patient No   Level of care: Progressive Cardiac  Consultants:   General surgery  Procedures:   None  Antimicrobials:  Zosyn   Subjective: -Uncomfortable, wants to eat, had nausea and earlier, no abdominal pain today  Objective: Vitals:   05/08/21 0325 05/08/21 0327 05/08/21 1015 05/08/21 1400  BP: (!) 106/55  111/71 (!) 108/54  Pulse: 75 76 74 76  Resp: 18  18 18   Temp: 98.3 F (36.8 C)  98 F (36.7 C) 98.2 F (36.8 C)  TempSrc: Oral   Oral  SpO2: (!) 82% 93% 91% 92%  Weight:      Height:        Intake/Output Summary (Last 24 hours) at 05/08/2021 1510 Last data filed at 05/08/2021 1330 Gross per 24 hour  Intake 1125.78 ml  Output 500 ml  Net 625.78 ml   07/08/2021  05/06/21 0200 05/06/21 0714 05/07/21 1222  Weight: 104 kg 103.5 kg 105 kg    Examination:  General exam: Obese chronically ill pleasant female sitting up in bed, AAOx3, appears much more comfortable today HEENT: Neck obese unable to assess JVD, no icterus CVS: S1-S2, regular rate rhythm Lungs: Decreased breath  sounds to bases otherwise clear Abdomen: Soft, mildly distended, nontender, bowel sounds present, obese Extremities: No edema  Skin: No rashes on exposed skin Psychiatry: Mood & affect appropriate.     Data Reviewed: I have personally reviewed following labs and imaging studies  CBC: Recent Labs  Lab 05/05/21 1113 05/06/21 0514 05/07/21 0423 05/08/21 0412  WBC 13.0* 9.5 7.6 6.0  NEUTROABS 11.5*  --  5.8 4.5  HGB 15.1* 13.5 13.4 13.2  HCT 44.8 39.9 41.2 40.2  MCV 91.6 92.8 95.8 94.6  PLT 142* 129* 124* 138*   Basic Metabolic Panel: Recent Labs  Lab 05/05/21 1113 05/05/21 1139 05/06/21 0514 05/07/21 0423 05/08/21 0412  NA 134*  --  138 138 139  K 2.9*  --  3.7 3.6 3.7  CL 94*  --  102 100 101  CO2 25  --  29 29 29   GLUCOSE 202*  --  106* 84 119*  BUN 19  --  13 11 7   CREATININE 0.90  --  0.58 0.60 0.58  CALCIUM 8.7*  --  8.2* 8.4* 8.5*  MG 1.5*  --  2.2  --   --   PHOS  --  3.6  --   --   --    GFR: Estimated Creatinine Clearance: 86.7 mL/min (by C-G formula based on SCr of 0.58 mg/dL). Liver Function Tests: Recent Labs  Lab 05/05/21 1113 05/06/21 0929 05/07/21 0423 05/08/21 0412  AST 277* 137* 102* 76*  ALT 145* 96* 79* 63*  ALKPHOS 72 59 68 75  BILITOT 4.3* 3.9* 2.5* 1.6*  PROT 7.0 6.3* 5.9* 6.1*  ALBUMIN 3.5 3.0* 3.0* 2.9*   Recent Labs  Lab 05/05/21 1423 05/06/21 0929  LIPASE 243* 65*   No results for input(s): AMMONIA in the last 168 hours. Coagulation Profile: Recent Labs  Lab 05/05/21 1113  INR 1.1   Cardiac Enzymes: Recent Labs  Lab 05/05/21 1113  CKTOTAL 62   BNP (last 3 results) No results for input(s): PROBNP in the last 8760 hours. HbA1C: No results for input(s): HGBA1C in the last 72 hours. CBG: Recent Labs  Lab 05/07/21 1156 05/07/21 1642 05/07/21 2025 05/08/21 0746 05/08/21 1158  GLUCAP 71 103* 150* 139* 124*   Lipid Profile: No results for input(s): CHOL, HDL, LDLCALC, TRIG, CHOLHDL, LDLDIRECT in the last 72  hours. Thyroid Function Tests: No results for input(s): TSH, T4TOTAL, FREET4, T3FREE, THYROIDAB in the last 72 hours. Anemia Panel: No results for input(s): VITAMINB12, FOLATE, FERRITIN, TIBC, IRON, RETICCTPCT in the last 72 hours. Sepsis Labs: Recent Labs  Lab 05/05/21 1331 05/05/21 1551 05/05/21 1757 05/06/21 0929 05/06/21 1236 05/07/21 0423 05/08/21 0412  PROCALCITON  --  14.52  --  4.59  --  2.64 1.48  LATICACIDVEN 4.1*  --  2.0* 1.2 1.2  --   --     Recent Results (from the past 240 hour(s))  Urine culture     Status: Abnormal   Collection Time: 05/05/21 11:12 AM   Specimen: In/Out Cath Urine  Result Value Ref Range Status   Specimen Description   Final    IN/OUT CATH URINE Performed at [facility], [address]  [place], [place] [address]    Special Requests   Final    NONE Performed at [facility], [address] [place], [place] [address]    Culture MULTIPLE SPECIES PRESENT, SUGGEST RECOLLECTION (A)  Final   Report Status 05/07/2021 FINAL  Final  Resp Panel by RT-PCR (Flu A&B, Covid) Nasopharyngeal Swab     Status: None   Collection Time: 05/05/21 11:13 AM   Specimen: Nasopharyngeal Swab; Nasopharyngeal(NP) swabs in vial transport medium  Result Value Ref Range Status   SARS Coronavirus 2 by RT PCR NEGATIVE NEGATIVE Final    Comment: (NOTE) SARS-CoV-2 target nucleic acids are NOT DETECTED.  The SARS-CoV-2 RNA is generally detectable in upper respiratory specimens during the acute phase of infection. The lowest concentration of SARS-CoV-2 viral copies this assay can detect is 138 copies/mL. A negative result does not preclude SARS-Cov-2 infection and should not be used as the sole basis for treatment or other patient management decisions. A negative result may occur with  improper specimen collection/handling, submission of specimen other than nasopharyngeal swab, presence of viral mutation(s) within the areas targeted by this assay,  and inadequate number of viral copies(<138 copies/mL). A negative result must be combined with clinical observations, patient history, and epidemiological information. The expected result is Negative.  Fact Sheet for Patients:  BloggerCourse.com  Fact Sheet for Healthcare Providers:  SeriousBroker.it  This test is no t yet approved or cleared by the [place] FDA and  has been authorized for detection and/or diagnosis of SARS-CoV-2 by FDA under an Emergency Use Authorization (EUA). This EUA will remain  in effect (meaning this test can be used) for the duration of the COVID-19 declaration under Section 564(b)(1) of the Act, 21 U.S.C.section 360bbb-3(b)(1), unless the authorization is terminated  or revoked sooner.       Influenza A by PCR NEGATIVE NEGATIVE Final   Influenza B by PCR NEGATIVE NEGATIVE Final    Comment: (NOTE) The Xpert Xpress SARS-CoV-2/FLU/RSV plus assay is intended as an aid in the diagnosis of influenza from Nasopharyngeal swab specimens and should not be used as a sole basis for treatment. Nasal washings and aspirates are unacceptable for Xpert Xpress SARS-CoV-2/FLU/RSV testing.  Fact Sheet for Patients: BloggerCourse.com  Fact Sheet for Healthcare Providers: SeriousBroker.it  This test is not yet approved or cleared by the [place] FDA and has been authorized for detection and/or diagnosis of SARS-CoV-2 by FDA under an Emergency Use Authorization (EUA). This EUA will remain in effect (meaning this test can be used) for the duration of the COVID-19 declaration under Section 564(b)(1) of the Act, 21 U.S.C. section 360bbb-3(b)(1), unless the authorization is terminated or revoked.  Performed at [facility], [address] [place], [place] [address]   Blood culture (routine single)     Status: None (Preliminary result)   Collection  Time: 05/05/21 11:13 AM   Specimen: BLOOD  Result Value Ref Range Status   Specimen Description BLOOD LAC  Final   Special Requests   Final    BOTTLES DRAWN AEROBIC AND ANAEROBIC Blood Culture results may not be optimal due to an excessive volume of blood received in culture bottles   Culture   Final    NO GROWTH 3 DAYS Performed at [facility], [address], [place], [place] [address]    Report Status PENDING  Incomplete  Culture, blood (Routine X 2) w Reflex to ID Panel     Status: None (Preliminary result)   Collection Time: 05/05/21  12:30 PM   Specimen: BLOOD  Result Value Ref Range Status   Specimen Description BLOOD BLOOD LEFT HAND  Final   Special Requests   Final    BOTTLES DRAWN AEROBIC AND ANAEROBIC Blood Culture results may not be optimal due to an excessive volume of blood received in culture bottles   Culture   Final    NO GROWTH 3 DAYS Performed at [facility], [address] [place], [place] [id number]    Report Status PENDING  Incomplete         Radiology Studies: NM Hepatobiliary Liver Func  Result Date: 05/07/2021 CLINICAL DATA:  History of abnormal ultrasound and CT suggestive of acute cholecystitis, cholelithiasis EXAM: NUCLEAR MEDICINE HEPATOBILIARY IMAGING TECHNIQUE: Sequential images of the abdomen were obtained out to 60 minutes following intravenous administration of radiopharmaceutical. RADIOPHARMACEUTICALS:  6.99 mCi [id number]  Choletec IV COMPARISON:  CT and ultrasound from 05/05/2021 and MRI from 05/06/2021 FINDINGS: Prompt uptake and biliary excretion of activity by the liver is seen. Gallbladder activity is visualized, consistent with patency of cystic duct. Biliary activity passes into small bowel, consistent with patent common bile duct. IMPRESSION: Normal uptake and excretion of biliary tracer. The cystic duct is patent. Electronically Signed   By: Alcide CleverMark  Lukens M.D.   On: 05/07/2021 17:38    Scheduled Meds: . enoxaparin  (LOVENOX) injection  0.5 mg/kg Subcutaneous Q24H  . insulin aspart  0-5 Units Subcutaneous QHS  . insulin aspart  0-9 Units Subcutaneous TID WC  . lidocaine  1 patch Transdermal Q24H  . mouth rinse  15 mL Mouth Rinse BID  . nicotine  21 mg Transdermal Daily  . pravastatin  40 mg Oral q1800  . thyroid  90 mg Oral QAC breakfast   Continuous Infusions:    LOS: 3 days    Time spent: 25 minutes    Zannie CovePreetha Celestina Gironda, MD [facility]5/11/2021, 3:10 PM

## 2021-05-08 NOTE — Progress Notes (Signed)
Mobility Specialist - Progress Note   05/08/21 1100  Mobility  Activity Ambulated in room;Transferred to/from [facility]  Level of Assistance Independent  Assistive Device None  Distance Ambulated (ft) 30 ft  Mobility Out of bed for toileting;Ambulated independently in room  Mobility Response Tolerated well  Mobility performed by Mobility specialist  $Mobility charge 1 Mobility    Pre-mobility: 72 HR, 89% SpO2 During mobility: 80 HR, 88% SpO2 Post-mobility: 75 HR, 91% SpO2   O2 flunctuating between 89-92% on arrival. Utilizing 1L. Transferred to [facility] prior to ambulation in room. Independent. Mild SOB, PLB engaged. Desat to a low of 88%. Mild lightheadedness during ambulation, states she's always lightheaded.    Filiberto Pinks Mobility Specialist 05/08/21, 11:35 AM

## 2021-05-08 NOTE — Progress Notes (Signed)
Pt refusing cbg. States they "took my sugar earlier and I am not getting stuck again." Pt educated.

## 2021-05-09 LAB — CBC WITH DIFFERENTIAL/PLATELET
Abs Immature Granulocytes: 0.05 10*3/uL (ref 0.00–0.07)
Basophils Absolute: 0.1 10*3/uL (ref 0.0–0.1)
Basophils Relative: 1 %
Eosinophils Absolute: 0.2 10*3/uL (ref 0.0–0.5)
Eosinophils Relative: 4 %
HCT: 42.2 % (ref 36.0–46.0)
Hemoglobin: 13.7 g/dL (ref 12.0–15.0)
Immature Granulocytes: 1 %
Lymphocytes Relative: 33 %
Lymphs Abs: 1.9 10*3/uL (ref 0.7–4.0)
MCH: 30.7 pg (ref 26.0–34.0)
MCHC: 32.5 g/dL (ref 30.0–36.0)
MCV: 94.6 fL (ref 80.0–100.0)
Monocytes Absolute: 0.4 10*3/uL (ref 0.1–1.0)
Monocytes Relative: 7 %
Neutro Abs: 3 10*3/uL (ref 1.7–7.7)
Neutrophils Relative %: 54 %
Platelets: 142 10*3/uL — ABNORMAL LOW (ref 150–400)
RBC: 4.46 MIL/uL (ref 3.87–5.11)
RDW: 14.4 % (ref 11.5–15.5)
Smear Review: NORMAL
WBC: 5.7 10*3/uL (ref 4.0–10.5)
nRBC: 0 % (ref 0.0–0.2)

## 2021-05-09 LAB — COMPREHENSIVE METABOLIC PANEL
ALT: 52 U/L — ABNORMAL HIGH (ref 0–44)
AST: 59 U/L — ABNORMAL HIGH (ref 15–41)
Albumin: 3 g/dL — ABNORMAL LOW (ref 3.5–5.0)
Alkaline Phosphatase: 77 U/L (ref 38–126)
Anion gap: 9 (ref 5–15)
BUN: <5 mg/dL — ABNORMAL LOW (ref 6–20)
CO2: 31 mmol/L (ref 22–32)
Calcium: 8.3 mg/dL — ABNORMAL LOW (ref 8.9–10.3)
Chloride: 100 mmol/L (ref 98–111)
Creatinine, Ser: 0.47 mg/dL (ref 0.44–1.00)
GFR, Estimated: >60 mL/min (ref >60)
Glucose, Bld: 109 mg/dL — ABNORMAL HIGH (ref 70–99)
Potassium: 3.4 mmol/L — ABNORMAL LOW (ref 3.5–5.1)
Sodium: 140 mmol/L (ref 135–145)
Total Bilirubin: 1.1 mg/dL (ref 0.3–1.2)
Total Protein: 6.3 g/dL — ABNORMAL LOW (ref 6.5–8.1)

## 2021-05-09 LAB — GLUCOSE, CAPILLARY: Glucose-Capillary: 118 mg/dL — ABNORMAL HIGH (ref 70–99)

## 2021-05-09 MED ORDER — OXYCODONE HCL 5 MG PO TABS: 5.0000 mg | ORAL_TABLET | Freq: Four times a day (QID) | ORAL | 0 refills | Status: DC | PRN

## 2021-05-09 MED ORDER — LIDOCAINE 5 % EX PTCH
1.0000 | MEDICATED_PATCH | CUTANEOUS | 0 refills | Status: DC
Start: 2021-05-10 — End: 2022-04-20

## 2021-05-09 MED ORDER — POTASSIUM CHLORIDE CRYS ER 20 MEQ PO TBCR
20.0000 meq | EXTENDED_RELEASE_TABLET | Freq: Once | ORAL | Status: AC
  Administered 2021-05-09: 10:00:00 20 meq via ORAL
  Filled 2021-05-09: qty 1

## 2021-05-09 MED ORDER — CYCLOBENZAPRINE HCL 5 MG PO TABS: 5.0000 mg | ORAL_TABLET | Freq: Three times a day (TID) | ORAL | 0 refills | Status: DC | PRN

## 2021-05-09 NOTE — Progress Notes (Signed)
Pt d/c to home via husband. IV's removed intact. VSS. Education completed. All questions answered. All belongings sent with pt. ?

## 2021-05-10 LAB — CULTURE, BLOOD (SINGLE): Culture: NO GROWTH

## 2021-05-10 LAB — CULTURE, BLOOD (ROUTINE X 2): Culture: NO GROWTH

## 2021-05-10 NOTE — Discharge Summary (Signed)
Physician Discharge Summary  Danielle Franklin Kalp ZOX:[id number] DOB: 12/19/1961 DOA: 05/05/2021  PCP: Gracelyn NurseJohnston, John D, MD  Admit date: 05/05/2021 Discharge date: 05/09/2021  Time spent: 35 minutes  Recommendations for Outpatient Follow-up:  PCP Dr. Letitia LibraJohnston in 1 week Patient needs [facility], further evaluation of hilar/mediastinal lymphadenopathy  Discharge Diagnoses:  Principal Problem: Sepsis Abnormal LFTs Possible gastroenteritis Hilar and mediastinal lymphadenopathy Tobacco abuse Obesity Type 2 diabetes mellitus   Hypothyroid   Hypokalemia   Severe sepsis with septic shock (HCC)   Tobacco abuse   Hypomagnesemia   Hilar adenopathy   Abnormal LFTs   Discharge Condition: Stable  Diet recommendation: Diabetic  Filed Weights   05/06/21 0714 05/07/21 1222 05/09/21 0033  Weight: 103.5 kg 105 kg 104.8 kg    History of present illness:  60 y.o.femalewith medical history significant ofhypertension, hyperlipidemia, diabetes mellitus, hypothyroidism, tobacco abuse, tickborne fever, who presented with nausea, vomiting, weakness. Patient states that she has not been feeling well for almost a week. She has nausea and had a multiple times of nonbilious nonbloody vomiting.She she had chills, but no fever at home. No symptoms of UTI. Patient has a chronic mild shortness of breath and in mild withdrawal coughing which she attributes to smoking, no significant change. Chest CT showed no evidence of pulmonary embolism however did show significant hilar, mediastinal adenopathy.  RUQ ultrasound did show some evidence of gallbladder wall thickening.  Hospital Course:   Severe sepsis Suspected cholecystitis, Abnormal LFTs On admission had nausea vomiting associated with leukocytosis, tachycardia tachypnea, lactic acidosis, sepsis has resolved -Initially suspected to have acute cholecystitis, treated with bowel rest, IV Zosyn, fluids, supportive care  improving now, started on bowel rest, IV Zosyn, IV fluids for suspected cholecystitis -Right upper quadrant ultrasound and MRCP concerning for possible cholecystitis -General surgery consulted, recommended HIDA scan, HIDA scan noted patent cystic duct, rules out acute cholecystitis -Could have been gastroenteritis, possibly even UTI, blood cultures are negative, fluid and respiratory virus panel also negative, urine culture grew multiple species -Regardless clinically improved with supportive care, antibiotics were discontinued, she remained afebrile and nontoxic off antibiotics -Tolerating regular diet at the time of discharge  Hilar, mediastinal adenopathy CTA showed mild mediastinal, right hilar and upper abdominal adenopathy -Given her smoking history, malignancy is a consideration -Radiologist raise concern for lymphoma, LDH is normal, peripheral smear unremarkable except for mild thrombocytopenia -Recommended pulmonary follow-up for this, patient [facility], hence will need referral from her PCP [facility]  Low back pain, symptoms of sciatica -No acute etiology noted on CT abdomen pelvis and MRCP -Treated with trial of lidocaine patch, Flexeril, as needed oxycodone -Stable, improved on current regimen, encouraged ambulation  DM 2 -CBGs are stable, glimepiride resumed  Hypothyroidism -Stable, home meds continued  Hypokalemiaand hypomagnesemia -replaced  Tobacco abuse -Nicotine patch -Per documentation patient has been smoking since age 60 without plans to quit   Discharge Exam: Vitals:   05/09/21 0058 05/09/21 0620  BP:  134/73  Pulse:  76  Resp:  16  Temp:  98 F (36.7 C)  SpO2: 92% 95%    General: AAOx3 Cardiovascular: S1S2/RRR Respiratory: CTAB  Discharge Instructions   Discharge Instructions    Diet - low sodium heart healthy   Complete by: As directed    Diet Carb Modified   Complete by: As  directed    Increase activity slowly   Complete by: As directed      Allergies  as of 05/09/2021      Reactions   Caduet [amlodipine-atorvastatin]       Medication List    STOP taking these medications   atenolol 100 MG tablet Commonly known as: TENORMIN   atenolol-chlorthalidone 50-25 MG tablet Commonly known as: TENORETIC   doxycycline 100 MG tablet Commonly known as: VIBRA-TABS   potassium chloride 10 MEQ tablet Commonly known as: KLOR-CON     TAKE these medications   Armour Thyroid 90 MG tablet Generic drug: thyroid Take 90 mg by mouth daily.   cyclobenzaprine 5 MG tablet Commonly known as: FLEXERIL Take 1 tablet (5 mg total) by mouth 3 (three) times daily as needed for muscle spasms (back pain/stiffness).   glimepiride 1 MG tablet Commonly known as: AMARYL Take 1 mg by mouth daily.   lidocaine 5 % Commonly known as: LIDODERM Place 1 patch onto the skin daily. Remove & Discard patch within 12 hours or as directed by MD   lovastatin 40 MG tablet Commonly known as: MEVACOR Take 1 tablet by mouth at bedtime.   ondansetron 4 MG tablet Commonly known as: ZOFRAN Take 1 tablet (4 mg total) by mouth every 6 (six) hours as needed for nausea.   oxyCODONE 5 MG immediate release tablet Commonly known as: Oxy IR/ROXICODONE Take 1 tablet (5 mg total) by mouth every 6 (six) hours as needed for severe pain.      Allergies  Allergen Reactions  . Caduet [Amlodipine-Atorvastatin]     Follow-up Information    Gracelyn Nurse, MD. Schedule an appointment as soon as possible for a visit in 1 week(s).   Specialty: Internal Medicine Why: please get a referral for Pulmonary at [facility] information: [address] [place] [address] [phone]                The results of significant diagnostics from this hospitalization (including imaging, microbiology, ancillary and laboratory) are listed below for reference.    Significant  Diagnostic Studies: CT Angio Chest PE W and/or Wo Contrast  Result Date: 05/05/2021 CLINICAL DATA:  Just weakness and low back pain. Suspect pulmonary embolism. Abdominal pain. EXAM: CT ANGIOGRAPHY CHEST CT ABDOMEN AND PELVIS WITH CONTRAST TECHNIQUE: Multidetector CT imaging of the chest was performed using the standard protocol during bolus administration of intravenous contrast. Multiplanar CT image reconstructions and MIPs were obtained to evaluate the vascular anatomy. Multidetector CT imaging of the abdomen and pelvis was performed using the standard protocol during bolus administration of intravenous contrast. CONTRAST:  41mL OMNIPAQUE IOHEXOL 350 MG/ML SOLN COMPARISON:  None. FINDINGS: CTA CHEST FINDINGS Cardiovascular: Borderline cardiomegaly. Minimal calcified plaque over the left main and 3 vessel coronary arteries. Thoracic aorta is normal caliber. Minimal calcified plaque over the thoracic aorta. Pulmonary arterial system is well opacified without evidence of emboli. Mediastinum/Nodes: Increased number of mediastinal lymph nodes with a few mildly enlarged mediastinal lymph nodes present. There is a 1.3 cm AP window lymph node. 1.1 cm 1.2 cm precarinal lymph node. 1.6 cm subcarinal lymph node. No left hilar adenopathy. 1.1 cm right hilar lymph node. 1 cm lymph node anterior to the distal esophagus. Remaining mediastinal structures are unremarkable. Lungs/Pleura: Lungs are adequately inflated without evidence of effusion. Minimal linear density over the medial lingula and right middle lobe compatible with scarring/atelectasis. No focal airspace consolidation. Airways are normal. Musculoskeletal: Minimal degenerative change of the spine. Review of the MIP images confirms the above findings. CT ABDOMEN and PELVIS FINDINGS Hepatobiliary: Moderate cholelithiasis. Liver and  biliary tree are normal. Pancreas: Normal. Spleen: Normal. Adrenals/Urinary Tract: Adrenal glands are normal. Kidneys are normal in size  without hydronephrosis or nephrolithiasis. Ureters and bladder are normal. Stomach/Bowel: Stomach is normal. Prominent diverticulum over the convex surface of the duodenal C sweep. Remainder of small bowel is unremarkable. Appendix is not visualized. Colon is normal. Vascular/Lymphatic: Calcified plaque over the abdominal aorta which is normal in caliber. Mild adenopathy in the region of the celiac axis with 1.2 cm lymph node noted as well as over the porta hepatis. Mild adenopathy over the gastrohepatic ligament with increased number of subcentimeter nodes over the retrocrural space and periaortic region. Reproductive: Normal. Other: No free fluid or focal inflammatory change. Musculoskeletal: Mild degenerative change of the spine. Review of the MIP images confirms the above findings. IMPRESSION: 1. No evidence of pulmonary embolism or acute cardiopulmonary disease. 2. Mild mediastinal, right hilar and upper abdominal adenopathy as described. Findings are nonspecific and may be due to neoplastic process such as leukemia/lymphoma or metastatic disease versus an infectious or inflammatory process. Recommend clinical correlation for primary neoplasm. 3. Moderate cholelithiasis. 4. Aortic atherosclerosis. Atherosclerotic coronary artery disease. Mild cardiomegaly. Aortic Atherosclerosis (ICD10-I70.0). Electronically Signed   By: Elberta Fortis M.D.   On: 05/05/2021 14:03   NM Hepatobiliary Liver Func  Result Date: 05/07/2021 CLINICAL DATA:  History of abnormal ultrasound and CT suggestive of acute cholecystitis, cholelithiasis EXAM: NUCLEAR MEDICINE HEPATOBILIARY IMAGING TECHNIQUE: Sequential images of the abdomen were obtained out to 60 minutes following intravenous administration of radiopharmaceutical. RADIOPHARMACEUTICALS:  6.99 mCi Tc-47m  Choletec IV COMPARISON:  CT and ultrasound from 05/05/2021 and MRI from 05/06/2021 FINDINGS: Prompt uptake and biliary excretion of activity by the liver is seen. Gallbladder  activity is visualized, consistent with patency of cystic duct. Biliary activity passes into small bowel, consistent with patent common bile duct. IMPRESSION: Normal uptake and excretion of biliary tracer. The cystic duct is patent. Electronically Signed   By: Alcide Clever M.D.   On: 05/07/2021 17:38   CT ABDOMEN PELVIS W CONTRAST  Result Date: 05/05/2021 CLINICAL DATA:  Just weakness and low back pain. Suspect pulmonary embolism. Abdominal pain. EXAM: CT ANGIOGRAPHY CHEST CT ABDOMEN AND PELVIS WITH CONTRAST TECHNIQUE: Multidetector CT imaging of the chest was performed using the standard protocol during bolus administration of intravenous contrast. Multiplanar CT image reconstructions and MIPs were obtained to evaluate the vascular anatomy. Multidetector CT imaging of the abdomen and pelvis was performed using the standard protocol during bolus administration of intravenous contrast. CONTRAST:  75mL OMNIPAQUE IOHEXOL 350 MG/ML SOLN COMPARISON:  None. FINDINGS: CTA CHEST FINDINGS Cardiovascular: Borderline cardiomegaly. Minimal calcified plaque over the left main and 3 vessel coronary arteries. Thoracic aorta is normal caliber. Minimal calcified plaque over the thoracic aorta. Pulmonary arterial system is well opacified without evidence of emboli. Mediastinum/Nodes: Increased number of mediastinal lymph nodes with a few mildly enlarged mediastinal lymph nodes present. There is a 1.3 cm AP window lymph node. 1.1 cm 1.2 cm precarinal lymph node. 1.6 cm subcarinal lymph node. No left hilar adenopathy. 1.1 cm right hilar lymph node. 1 cm lymph node anterior to the distal esophagus. Remaining mediastinal structures are unremarkable. Lungs/Pleura: Lungs are adequately inflated without evidence of effusion. Minimal linear density over the medial lingula and right middle lobe compatible with scarring/atelectasis. No focal airspace consolidation. Airways are normal. Musculoskeletal: Minimal degenerative change of the  spine. Review of the MIP images confirms the above findings. CT ABDOMEN and PELVIS FINDINGS Hepatobiliary: Moderate cholelithiasis. Liver and  biliary tree are normal. Pancreas: Normal. Spleen: Normal. Adrenals/Urinary Tract: Adrenal glands are normal. Kidneys are normal in size without hydronephrosis or nephrolithiasis. Ureters and bladder are normal. Stomach/Bowel: Stomach is normal. Prominent diverticulum over the convex surface of the duodenal C sweep. Remainder of small bowel is unremarkable. Appendix is not visualized. Colon is normal. Vascular/Lymphatic: Calcified plaque over the abdominal aorta which is normal in caliber. Mild adenopathy in the region of the celiac axis with 1.2 cm lymph node noted as well as over the porta hepatis. Mild adenopathy over the gastrohepatic ligament with increased number of subcentimeter nodes over the retrocrural space and periaortic region. Reproductive: Normal. Other: No free fluid or focal inflammatory change. Musculoskeletal: Mild degenerative change of the spine. Review of the MIP images confirms the above findings. IMPRESSION: 1. No evidence of pulmonary embolism or acute cardiopulmonary disease. 2. Mild mediastinal, right hilar and upper abdominal adenopathy as described. Findings are nonspecific and may be due to neoplastic process such as leukemia/lymphoma or metastatic disease versus an infectious or inflammatory process. Recommend clinical correlation for primary neoplasm. 3. Moderate cholelithiasis. 4. Aortic atherosclerosis. Atherosclerotic coronary artery disease. Mild cardiomegaly. Aortic Atherosclerosis (ICD10-I70.0). Electronically Signed   By: Elberta Fortis M.D.   On: 05/05/2021 14:03   MR ABDOMEN MRCP WO CONTRAST  Result Date: 05/06/2021 CLINICAL DATA:  Nausea vomiting and weakness. EXAM: MRI ABDOMEN WITHOUT CONTRAST  (INCLUDING MRCP) TECHNIQUE: Multiplanar multisequence MR imaging of the abdomen was performed. Heavily T2-weighted images of the biliary  and pancreatic ducts were obtained, and three-dimensional MRCP images were rendered by post processing. COMPARISON:  CT scan 05/05/2021 FINDINGS: Motion degraded study. Lower chest: Unremarkable Hepatobiliary: Diffuse loss of signal intensity in the liver parenchyma on out of phase T1 imaging is consistent with fatty deposition. No focal abnormality within the liver parenchyma on noncontrast imaging tiny layering gallstones noted in the gallbladder with gallbladder wall thickening and/or pericholecystic edema/fluid. Adenomyomatosis noted at the gallbladder fundus. No intra or extrahepatic biliary duct dilatation. MRCP images degraded by motion artifact, but no evidence of choledocholithiasis within this limitation. Common bile duct is nondilated in the head of pancreas measuring 3-4 mm diameter. Pancreas: No focal mass lesion. No dilatation of the main duct. No intraparenchymal cyst. No peripancreatic edema. Spleen:  No splenomegaly. No focal mass lesion. Adrenals/Urinary Tract: No adrenal nodule or mass. Kidneys unremarkable. Stomach/Bowel: Stomach is unremarkable. No gastric wall thickening. No evidence of outlet obstruction. Duodenum is normally positioned as is the ligament of Treitz. Duodenal diverticulum noted. No small bowel or colonic dilatation within the visualized abdomen. 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 Vascular/Lymphatic: No abdominal aortic aneurysm. No abdominal lymphadenopathy Other: Probable mild retroperitoneal edema in the upper abdomen. Trace fluid noted in the pelvis on coronal T2 imaging. Musculoskeletal: No suspicious marrow signal abnormality within the visualized bony anatomy. IMPRESSION: 1. Motion degraded study. 2. Cholelithiasis with gallbladder wall thickening and/or pericholecystic edema/fluid. Acute cholecystitis a consideration. Right upper quadrant ultrasound yesterday documented gallbladder wall thickening. Nuclear scintigraphy  could be used to assess for cystic duct obstruction as clinically warranted. 3. No intra or extrahepatic biliary duct dilatation. No evidence of choledocholithiasis. 4. Probable mild retroperitoneal edema in the upper abdomen with trace fluid in the pelvis. Electronically Signed   By: Kennith Center M.D.   On: 05/06/2021 16:14   MR 3D Recon At Scanner  Result Date: 05/06/2021 CLINICAL DATA:  Nausea vomiting and weakness. EXAM: MRI ABDOMEN WITHOUT CONTRAST  (INCLUDING MRCP) TECHNIQUE: Multiplanar  multisequence MR imaging of the abdomen was performed. Heavily T2-weighted images of the biliary and pancreatic ducts were obtained, and three-dimensional MRCP images were rendered by post processing. COMPARISON:  CT scan 05/05/2021 FINDINGS: Motion degraded study. Lower chest: Unremarkable Hepatobiliary: Diffuse loss of signal intensity in the liver parenchyma on out of phase T1 imaging is consistent with fatty deposition. No focal abnormality within the liver parenchyma on noncontrast imaging tiny layering gallstones noted in the gallbladder with gallbladder wall thickening and/or pericholecystic edema/fluid. Adenomyomatosis noted at the gallbladder fundus. No intra or extrahepatic biliary duct dilatation. MRCP images degraded by motion artifact, but no evidence of choledocholithiasis within this limitation. Common bile duct is nondilated in the head of pancreas measuring 3-4 mm diameter. Pancreas: No focal mass lesion. No dilatation of the main duct. No intraparenchymal cyst. No peripancreatic edema. Spleen:  No splenomegaly. No focal mass lesion. Adrenals/Urinary Tract: No adrenal nodule or mass. Kidneys unremarkable. Stomach/Bowel: Stomach is unremarkable. No gastric wall thickening. No evidence of outlet obstruction. Duodenum is normally positioned as is the ligament of Treitz. Duodenal diverticulum noted. No small bowel or colonic dilatation within the visualized abdomen. Diverticular changes are seen diffusely  along the abdominal segments of the colon. Vascular/Lymphatic: No abdominal aortic aneurysm. No abdominal lymphadenopathy Other: Probable mild retroperitoneal edema in the upper abdomen. Trace fluid noted in the pelvis on coronal T2 imaging. Musculoskeletal: No suspicious marrow signal abnormality within the visualized bony anatomy. IMPRESSION: 1. Motion degraded study. 2. Cholelithiasis with gallbladder wall thickening and/or pericholecystic edema/fluid. Acute cholecystitis a consideration. Right upper quadrant ultrasound yesterday documented gallbladder wall thickening. Nuclear scintigraphy could be used to assess for cystic duct obstruction as clinically warranted. 3. No intra or extrahepatic biliary duct dilatation. No evidence of choledocholithiasis. 4. Probable mild retroperitoneal edema in the upper abdomen with trace fluid in the pelvis. Electronically Signed   By: Kennith Center M.D.   On: 05/06/2021 16:14   US Venous Img Lower Bilateral (DVT)  Result Date: 05/05/2021 CLINICAL DATA:  Positive D-dimer. EXAM: BILATERAL LOWER EXTREMITY VENOUS DOPPLER ULTRASOUND TECHNIQUE: Gray-scale sonography with graded compression, as well as color Doppler and duplex ultrasound were performed to evaluate the lower extremity deep venous systems from the level of the common femoral vein and including the common femoral, femoral, profunda femoral, popliteal and calf veins including the posterior tibial, peroneal and gastrocnemius veins when visible. The superficial great saphenous vein was also interrogated. Spectral Doppler was utilized to evaluate flow at rest and with distal augmentation maneuvers in the common femoral, femoral and popliteal veins. COMPARISON:  None. FINDINGS: RIGHT LOWER EXTREMITY Common Femoral Vein: No evidence of thrombus. Normal compressibility, respiratory phasicity and response to augmentation. Saphenofemoral Junction: No evidence of thrombus. Normal compressibility and flow on color Doppler  imaging. Profunda Femoral Vein: No evidence of thrombus. Normal compressibility and flow on color Doppler imaging. Femoral Vein: No evidence of thrombus. Normal compressibility, respiratory phasicity and response to augmentation. Popliteal Vein: No evidence of thrombus. Normal compressibility, respiratory phasicity and response to augmentation. Calf Veins: No evidence of thrombus. Normal compressibility and flow on color Doppler imaging. Superficial Great Saphenous Vein: No evidence of thrombus. Normal compressibility. Venous Reflux:  None. Other Findings:  None. LEFT LOWER EXTREMITY Common Femoral Vein: No evidence of thrombus. Normal compressibility, respiratory phasicity and response to augmentation. Saphenofemoral Junction: No evidence of thrombus. Normal compressibility and flow on color Doppler imaging. Profunda Femoral Vein: No evidence of thrombus. Normal compressibility and flow on color Doppler imaging. Femoral Vein: No evidence  of thrombus. Normal compressibility, respiratory phasicity and response to augmentation. Popliteal Vein: No evidence of thrombus. Normal compressibility, respiratory phasicity and response to augmentation. Calf Veins: No evidence of thrombus. Normal compressibility and flow on color Doppler imaging. Superficial Great Saphenous Vein: No evidence of thrombus. Normal compressibility. Venous Reflux:  None. Other Findings:  None. IMPRESSION: No evidence of deep venous thrombosis in either lower extremity. Electronically Signed   By: Elberta Fortis M.D.   On: 05/05/2021 18:25   DG Chest Port 1 View  Result Date: 05/05/2021 CLINICAL DATA:  Questionable sepsis EXAM: PORTABLE CHEST 1 VIEW COMPARISON:  06/15/2015 FINDINGS: Lingular opacity, favor atelectasis although infiltrate cannot be excluded. No confluent opacity on the right. Heart is normal size. No effusions or acute bony abnormality. IMPRESSION: Lingular atelectasis or infiltrate. Electronically Signed   By: Charlett Nose M.D.    On: 05/05/2021 11:42   US ABDOMEN LIMITED RUQ (LIVER/GB)  Result Date: 05/05/2021 CLINICAL DATA:  Elevated liver enzymes EXAM: ULTRASOUND ABDOMEN LIMITED RIGHT UPPER QUADRANT COMPARISON:  CT abdomen and pelvis May 05, 2021 FINDINGS: Gallbladder: Within the gallbladder, there are echogenic foci which move and shadow consistent with cholelithiasis. Largest gallstone measures 1.1 cm. There is gallbladder wall thickening. No appreciable pericholecystic fluid. No sonographic Murphy sign noted by sonographer. Common bile duct: Diameter: 4 mm. No intrahepatic or extrahepatic biliary duct dilatation. Liver: No focal lesion identified. Liver echogenicity is increased diffusely. Portal vein is patent on color Doppler imaging with normal direction of blood flow towards the liver. Other: None. IMPRESSION: 1. Cholelithiasis with gallbladder wall thickening. Concern for a degree of acute cholecystitis. This finding may warrant nuclear medicine hepatobiliary imaging study to assess for cystic duct patency. 2. Increased liver echogenicity, a finding indicative of hepatic steatosis. No focal liver lesions evident by ultrasound. Note that the sensitivity of ultrasound for detection of focal liver lesions is diminished in this circumstance. Electronically Signed   By: Bretta Bang III M.D.   On: 05/05/2021 15:06    Microbiology: Recent Results (from the past 240 hour(s))  Urine culture     Status: Abnormal   Collection Time: 05/05/21 11:12 AM   Specimen: In/Out Cath Urine  Result Value Ref Range Status   Specimen Description   Final    IN/OUT CATH URINE Performed at [facility], [address], [place], [place] [address]    Special Requests   Final    NONE Performed at [facility], [address] [place], [place] [address]    Culture MULTIPLE SPECIES PRESENT, SUGGEST RECOLLECTION (A)  Final   Report Status 05/07/2021 FINAL  Final  Resp Panel by RT-PCR (Flu A&B, Covid) Nasopharyngeal  Swab     Status: None   Collection Time: 05/05/21 11:13 AM   Specimen: Nasopharyngeal Swab; Nasopharyngeal(NP) swabs in vial transport medium  Result Value Ref Range Status   SARS Coronavirus 2 by RT PCR NEGATIVE NEGATIVE Final    Comment: (NOTE) SARS-CoV-2 target nucleic acids are NOT DETECTED.  The SARS-CoV-2 RNA is generally detectable in upper respiratory specimens during the acute phase of infection. The lowest concentration of SARS-CoV-2 viral copies this assay can detect is 138 copies/mL. A negative result does not preclude SARS-Cov-2 infection and should not be used as the sole basis for treatment or other patient management decisions. A negative result may occur with  improper specimen collection/handling, submission of specimen other than nasopharyngeal swab, presence of viral mutation(s) within the areas targeted by this assay, and inadequate number of  viral copies(<138 copies/mL). A negative result must be combined with clinical observations, patient history, and epidemiological information. The expected result is Negative.  Fact Sheet for Patients:  BloggerCourse.com  Fact Sheet for Healthcare Providers:  SeriousBroker.it  This test is no t yet approved or cleared by the [place] FDA and  has been authorized for detection and/or diagnosis of SARS-CoV-2 by FDA under an Emergency Use Authorization (EUA). This EUA will remain  in effect (meaning this test can be used) for the duration of the COVID-19 declaration under Section 564(b)(1) of the Act, 21 U.S.C.section 360bbb-3(b)(1), unless the authorization is terminated  or revoked sooner.       Influenza A by PCR NEGATIVE NEGATIVE Final   Influenza B by PCR NEGATIVE NEGATIVE Final    Comment: (NOTE) The Xpert Xpress SARS-CoV-2/FLU/RSV plus assay is intended as an aid in the diagnosis of influenza from Nasopharyngeal swab specimens and should not be used as a sole  basis for treatment. Nasal washings and aspirates are unacceptable for Xpert Xpress SARS-CoV-2/FLU/RSV testing.  Fact Sheet for Patients: BloggerCourse.com  Fact Sheet for Healthcare Providers: SeriousBroker.it  This test is not yet approved or cleared by the [place] FDA and has been authorized for detection and/or diagnosis of SARS-CoV-2 by FDA under an Emergency Use Authorization (EUA). This EUA will remain in effect (meaning this test can be used) for the duration of the COVID-19 declaration under Section 564(b)(1) of the Act, 21 U.S.C. section 360bbb-3(b)(1), unless the authorization is terminated or revoked.  Performed at [facility], [address] [place], [place] [address]   Blood culture (routine single)     Status: None   Collection Time: 05/05/21 11:13 AM   Specimen: BLOOD  Result Value Ref Range Status   Specimen Description BLOOD LAC  Final   Special Requests   Final    BOTTLES DRAWN AEROBIC AND ANAEROBIC Blood Culture results may not be optimal due to an excessive volume of blood received in culture bottles   Culture   Final    NO GROWTH 5 DAYS Performed at [facility], [address] [place], [place] [address]    Report Status 05/10/2021 FINAL  Final  Culture, blood (Routine X 2) w Reflex to ID Panel     Status: None   Collection Time: 05/05/21 12:30 PM   Specimen: BLOOD  Result Value Ref Range Status   Specimen Description BLOOD BLOOD LEFT HAND  Final   Special Requests   Final    BOTTLES DRAWN AEROBIC AND ANAEROBIC Blood Culture results may not be optimal due to an excessive volume of blood received in culture bottles   Culture   Final    NO GROWTH 5 DAYS Performed at [facility], [address] [place], [place] [address]    Report Status 05/10/2021 FINAL  Final     Labs: Basic Metabolic Panel: Recent Labs  Lab 05/05/21 1113 05/05/21 1139 05/06/21 0514  05/07/21 0423 05/08/21 0412 05/09/21 0550  NA 134*  --  138 138 139 140  K 2.9*  --  3.7 3.6 3.7 3.4*  CL 94*  --  102 100 101 100  CO2 25  --  29 29 29 31   GLUCOSE 202*  --  106* 84 119* 109*  BUN 19  --  13 11 7  <5*  CREATININE 0.90  --  0.58 0.60 0.58 0.47  CALCIUM 8.7*  --  8.2* 8.4* 8.5* 8.3*  MG 1.5*  --  2.2  --   --   --  PHOS  --  3.6  --   --   --   --    Liver Function Tests: Recent Labs  Lab 05/05/21 1113 05/06/21 0929 05/07/21 0423 05/08/21 0412 05/09/21 0550  AST 277* 137* 102* 76* 59*  ALT 145* 96* 79* 63* 52*  ALKPHOS 72 59 68 75 77  BILITOT 4.3* 3.9* 2.5* 1.6* 1.1  PROT 7.0 6.3* 5.9* 6.1* 6.3*  ALBUMIN 3.5 3.0* 3.0* 2.9* 3.0*   Recent Labs  Lab 05/05/21 1423 05/06/21 0929  LIPASE 243* 65*   No results for input(s): AMMONIA in the last 168 hours. CBC: Recent Labs  Lab 05/05/21 1113 05/06/21 0514 05/07/21 0423 05/08/21 0412 05/09/21 0550  WBC 13.0* 9.5 7.6 6.0 5.7  NEUTROABS 11.5*  --  5.8 4.5 3.0  HGB 15.1* 13.5 13.4 13.2 13.7  HCT 44.8 39.9 41.2 40.2 42.2  MCV 91.6 92.8 95.8 94.6 94.6  PLT 142* 129* 124* 138* 142*   Cardiac Enzymes: Recent Labs  Lab 05/05/21 1113  CKTOTAL 62   BNP: BNP (last 3 results) Recent Labs    05/05/21 1552  BNP 99.2    ProBNP (last 3 results) No results for input(s): PROBNP in the last 8760 hours.  CBG: Recent Labs  Lab 05/07/21 2025 05/08/21 0746 05/08/21 1158 05/08/21 1724 05/09/21 0913  GLUCAP 150* 139* 124* 113* 118*       Signed:  Zannie Cove MD.  [facility] 05/10/2021, 3:56 PM

## 2021-08-11 ENCOUNTER — Other Ambulatory Visit: Payer: Self-pay | Admitting: Internal Medicine

## 2021-08-11 DIAGNOSIS — Z1231 Encounter for screening mammogram for malignant neoplasm of breast: Secondary | ICD-10-CM

## 2021-11-14 ENCOUNTER — Other Ambulatory Visit: Payer: Self-pay | Admitting: Specialist

## 2021-11-14 DIAGNOSIS — R590 Localized enlarged lymph nodes: Secondary | ICD-10-CM

## 2021-11-14 DIAGNOSIS — R0602 Shortness of breath: Secondary | ICD-10-CM

## 2021-11-25 ENCOUNTER — Other Ambulatory Visit: Payer: Self-pay

## 2021-11-25 ENCOUNTER — Ambulatory Visit
Admission: RE | Admit: 2021-11-25 | Discharge: 2021-11-25 | Disposition: A | Discharge status: Home / Self Care | Payer: 59 | Source: Ambulatory Visit | Attending: Internal Medicine | Admitting: Internal Medicine

## 2021-11-25 DIAGNOSIS — Z1231 Encounter for screening mammogram for malignant neoplasm of breast: Secondary | ICD-10-CM | POA: Insufficient documentation

## 2022-01-06 ENCOUNTER — Ambulatory Visit
Admission: RE | Admit: 2022-01-06 | Discharge: 2022-01-06 | Disposition: A | Discharge status: Home / Self Care | Payer: 59 | Source: Ambulatory Visit | Attending: Specialist | Admitting: Specialist

## 2022-01-06 ENCOUNTER — Other Ambulatory Visit: Payer: Self-pay

## 2022-01-06 DIAGNOSIS — R0602 Shortness of breath: Secondary | ICD-10-CM

## 2022-01-06 DIAGNOSIS — R590 Localized enlarged lymph nodes: Secondary | ICD-10-CM

## 2022-01-06 LAB — POCT I-STAT CREATININE: Creatinine, Ser: 0.8 mg/dL (ref 0.44–1.00)

## 2022-01-06 MED ORDER — IOHEXOL 300 MG/ML  SOLN
75.0000 mL | Freq: Once | INTRAMUSCULAR | Status: AC | PRN
  Administered 2022-01-06: 75 mL via INTRAVENOUS

## 2022-02-03 DIAGNOSIS — R0602 Shortness of breath: Secondary | ICD-10-CM | POA: Diagnosis not present

## 2022-02-03 DIAGNOSIS — Z720 Tobacco use: Secondary | ICD-10-CM | POA: Diagnosis not present

## 2022-02-03 DIAGNOSIS — R0609 Other forms of dyspnea: Secondary | ICD-10-CM | POA: Diagnosis not present

## 2022-02-03 DIAGNOSIS — J439 Emphysema, unspecified: Secondary | ICD-10-CM | POA: Diagnosis not present

## 2022-02-03 DIAGNOSIS — R590 Localized enlarged lymph nodes: Secondary | ICD-10-CM | POA: Diagnosis not present

## 2022-02-11 DIAGNOSIS — R1084 Generalized abdominal pain: Secondary | ICD-10-CM | POA: Diagnosis not present

## 2022-02-12 ENCOUNTER — Other Ambulatory Visit (HOSPITAL_COMMUNITY): Payer: Self-pay | Admitting: Internal Medicine

## 2022-02-12 ENCOUNTER — Other Ambulatory Visit: Payer: Self-pay | Admitting: Internal Medicine

## 2022-02-12 DIAGNOSIS — R1084 Generalized abdominal pain: Secondary | ICD-10-CM

## 2022-02-13 ENCOUNTER — Ambulatory Visit: Payer: 59

## 2022-02-16 ENCOUNTER — Ambulatory Visit: Admission: RE | Admit: 2022-02-16 | Payer: 59 | Source: Ambulatory Visit

## 2022-02-26 ENCOUNTER — Other Ambulatory Visit: Payer: Self-pay

## 2022-02-26 ENCOUNTER — Ambulatory Visit
Admission: RE | Admit: 2022-02-26 | Discharge: 2022-02-26 | Disposition: A | Discharge status: Home / Self Care | Payer: 59 | Source: Ambulatory Visit | Attending: Internal Medicine | Admitting: Internal Medicine

## 2022-02-26 DIAGNOSIS — R109 Unspecified abdominal pain: Secondary | ICD-10-CM | POA: Diagnosis not present

## 2022-02-26 DIAGNOSIS — R1084 Generalized abdominal pain: Secondary | ICD-10-CM | POA: Diagnosis not present

## 2022-02-26 DIAGNOSIS — I700 Atherosclerosis of aorta: Secondary | ICD-10-CM | POA: Diagnosis not present

## 2022-02-26 MED ORDER — IOHEXOL 300 MG/ML  SOLN
100.0000 mL | Freq: Once | INTRAMUSCULAR | Status: AC | PRN
  Administered 2022-02-26: 100 mL via INTRAVENOUS

## 2022-03-03 DIAGNOSIS — E118 Type 2 diabetes mellitus with unspecified complications: Secondary | ICD-10-CM | POA: Diagnosis not present

## 2022-03-03 DIAGNOSIS — E034 Atrophy of thyroid (acquired): Secondary | ICD-10-CM | POA: Diagnosis not present

## 2022-04-17 DIAGNOSIS — F17200 Nicotine dependence, unspecified, uncomplicated: Secondary | ICD-10-CM | POA: Diagnosis not present

## 2022-04-17 DIAGNOSIS — K8020 Calculus of gallbladder without cholecystitis without obstruction: Secondary | ICD-10-CM | POA: Diagnosis not present

## 2022-04-23 ENCOUNTER — Encounter
Admission: RE | Admit: 2022-04-23 | Discharge: 2022-04-23 | Disposition: A | Discharge status: Home / Self Care | Payer: 59 | Source: Ambulatory Visit | Attending: General Surgery | Admitting: General Surgery

## 2022-04-23 ENCOUNTER — Other Ambulatory Visit: Payer: Self-pay

## 2022-04-23 ENCOUNTER — Ambulatory Visit: Payer: Self-pay | Admitting: General Surgery

## 2022-04-23 DIAGNOSIS — Z79899 Other long term (current) drug therapy: Secondary | ICD-10-CM

## 2022-04-23 DIAGNOSIS — Z01818 Encounter for other preprocedural examination: Secondary | ICD-10-CM

## 2022-04-23 DIAGNOSIS — I10 Essential (primary) hypertension: Secondary | ICD-10-CM

## 2022-04-23 HISTORY — DX: Hypothyroidism, unspecified: E03.9

## 2022-04-23 HISTORY — DX: Fatty (change of) liver, not elsewhere classified: K76.0

## 2022-04-23 HISTORY — DX: Unspecified osteoarthritis, unspecified site: M19.90

## 2022-04-23 HISTORY — DX: Tobacco use: Z72.0

## 2022-04-23 HISTORY — DX: Unspecified asthma, uncomplicated: J45.909

## 2022-04-23 HISTORY — DX: Localized enlarged lymph nodes: R59.0

## 2022-04-23 HISTORY — DX: Sepsis, unspecified organism: A41.9

## 2022-04-23 NOTE — Patient Instructions (Signed)
Your procedure is scheduled on:04-29-22 Wednesday ?Report to the Registration Desk on the 1st floor of the Medical Mall.Then proceed to the 2nd floor Surgery Desk in the Medical Mall ?To find out your arrival time, please call [phone] between 1PM - 3PM on:04-28-22 Tuesday ? ?REMEMBER: ?Instructions that are not followed completely may result in serious medical risk, up to and including death; or upon the discretion of your surgeon and anesthesiologist your surgery may need to be rescheduled. ? ?Do not eat food OR drink any liquids after midnight the night before surgery.  ?No gum chewing, lozengers or hard candies. ? ?TAKE THESE MEDICATIONS THE MORNING OF SURGERY WITH A SIP OF WATER: ?-thyroid (ARMOUR)  ?-You may take Oxycodone for pain if needed ? ?One week prior to surgery: ?Stop Anti-inflammatories (NSAIDS) such as Advil, Aleve, Ibuprofen, Motrin, Naproxen, Naprosyn and Aspirin based products such as Excedrin, Goodys Powder, BC Powder.You may however, take Tylenol if needed for pain up until the day of surgery. ? ?Stop ANY OVER THE COUNTER supplements/vitamins NOW (04-23-22) until after surgery (GLUCOSAMINE,MAGNESIUM, vitamin C ,VITAMIN D)-Continue your potassium chloride (KLOR-CON) up until the day prior to surgery ? ?No Alcohol for 24 hours before or after surgery. ? ?No Smoking including e-cigarettes for 24 hours prior to surgery.  ?No chewable tobacco products for at least 6 hours prior to surgery.  ?No nicotine patches on the day of surgery. ? ?Do not use any "recreational" drugs for at least a week prior to your surgery.  ?Please be advised that the combination of cocaine and anesthesia may have negative outcomes, up to and including death. ?If you test positive for cocaine, your surgery will be cancelled. ? ?On the morning of surgery brush your teeth with toothpaste and water, you may rinse your mouth with mouthwash if you wish. ?Do not swallow any toothpaste or mouthwash. ? ?Use CHG Soap as directed on  instruction sheet. ? ?Do not wear jewelry, make-up, hairpins, clips or nail polish. ? ?Do not wear lotions, powders, or perfumes.  ? ?Do not shave body from the neck down 48 hours prior to surgery just in case you cut yourself which could leave a site for infection.  ?Also, freshly shaved skin may become irritated if using the CHG soap. ? ?Contact lenses, hearing aids and dentures may not be worn into surgery. ? ?Do not bring valuables to the hospital. [facility] is not responsible for any missing/lost belongings or valuables.  ? ?Notify your doctor if there is any change in your medical condition (cold, fever, infection). ? ?Wear comfortable clothing (specific to your surgery type) to the hospital. ? ?After surgery, you can help prevent lung complications by doing breathing exercises.  ?Take deep breaths and cough every 1-2 hours. Your doctor may order a device called an Incentive Spirometer to help you take deep breaths. ?When coughing or sneezing, hold a pillow firmly against your incision with both hands. This is called ?splinting.? Doing this helps protect your incision. It also decreases belly discomfort. ? ?If you are being admitted to the hospital overnight, leave your suitcase in the car. ?After surgery it may be brought to your room. ? ?If you are being discharged the day of surgery, you will not be allowed to drive home. ?You will need a responsible adult (18 years or older) to drive you home and stay with you that night.  ? ?If you are taking public transportation, you will need to have a responsible adult (18 years or older) with  you. ?Please confirm with your physician that it is acceptable to use public transportation.  ? ?Please call the Pre-admissions Testing Dept. at [phone] if you have any questions about these instructions. ? ?Surgery Visitation Policy: ? ?Patients undergoing a surgery or procedure may have two family members or support persons with them as long as the person is not  COVID-19 positive or experiencing its symptoms.  ?

## 2022-04-27 ENCOUNTER — Encounter
Admission: RE | Admit: 2022-04-27 | Discharge: 2022-04-27 | Disposition: A | Discharge status: Home / Self Care | Payer: 59 | Source: Ambulatory Visit | Attending: General Surgery | Admitting: General Surgery

## 2022-04-27 DIAGNOSIS — Z01818 Encounter for other preprocedural examination: Secondary | ICD-10-CM | POA: Diagnosis not present

## 2022-04-27 DIAGNOSIS — Z79899 Other long term (current) drug therapy: Secondary | ICD-10-CM | POA: Diagnosis not present

## 2022-04-27 DIAGNOSIS — I10 Essential (primary) hypertension: Secondary | ICD-10-CM | POA: Diagnosis not present

## 2022-04-27 DIAGNOSIS — E119 Type 2 diabetes mellitus without complications: Secondary | ICD-10-CM | POA: Insufficient documentation

## 2022-04-27 LAB — POTASSIUM: Potassium: 3.2 mmol/L — ABNORMAL LOW (ref 3.5–5.1)

## 2022-04-29 ENCOUNTER — Other Ambulatory Visit: Payer: Self-pay

## 2022-04-29 ENCOUNTER — Encounter
Admission: RE | Disposition: A | Discharge status: Home / Self Care | Payer: Self-pay | Source: Home / Self Care | Attending: General Surgery

## 2022-04-29 ENCOUNTER — Ambulatory Visit: Payer: 59 | Admitting: Anesthesiology

## 2022-04-29 ENCOUNTER — Ambulatory Visit
Admission: RE | Admit: 2022-04-29 | Discharge: 2022-04-29 | Disposition: A | Discharge status: Home / Self Care | Payer: 59 | Attending: General Surgery | Admitting: General Surgery

## 2022-04-29 ENCOUNTER — Encounter: Payer: Self-pay | Admitting: General Surgery

## 2022-04-29 DIAGNOSIS — Z7984 Long term (current) use of oral hypoglycemic drugs: Secondary | ICD-10-CM | POA: Insufficient documentation

## 2022-04-29 DIAGNOSIS — E039 Hypothyroidism, unspecified: Secondary | ICD-10-CM | POA: Insufficient documentation

## 2022-04-29 DIAGNOSIS — K760 Fatty (change of) liver, not elsewhere classified: Secondary | ICD-10-CM | POA: Diagnosis not present

## 2022-04-29 DIAGNOSIS — K7460 Unspecified cirrhosis of liver: Secondary | ICD-10-CM | POA: Diagnosis not present

## 2022-04-29 DIAGNOSIS — I10 Essential (primary) hypertension: Secondary | ICD-10-CM | POA: Insufficient documentation

## 2022-04-29 DIAGNOSIS — E119 Type 2 diabetes mellitus without complications: Secondary | ICD-10-CM | POA: Diagnosis not present

## 2022-04-29 DIAGNOSIS — F17210 Nicotine dependence, cigarettes, uncomplicated: Secondary | ICD-10-CM | POA: Insufficient documentation

## 2022-04-29 DIAGNOSIS — K8010 Calculus of gallbladder with chronic cholecystitis without obstruction: Secondary | ICD-10-CM | POA: Diagnosis not present

## 2022-04-29 DIAGNOSIS — K8020 Calculus of gallbladder without cholecystitis without obstruction: Secondary | ICD-10-CM | POA: Diagnosis not present

## 2022-04-29 DIAGNOSIS — M1990 Unspecified osteoarthritis, unspecified site: Secondary | ICD-10-CM | POA: Diagnosis not present

## 2022-04-29 LAB — GLUCOSE, CAPILLARY
Glucose-Capillary: 131 mg/dL — ABNORMAL HIGH (ref 70–99)
Glucose-Capillary: 158 mg/dL — ABNORMAL HIGH (ref 70–99)

## 2022-04-29 SURGERY — CHOLECYSTECTOMY, ROBOT-ASSISTED, LAPAROSCOPIC
Anesthesia: General | Site: Abdomen

## 2022-04-29 MED ORDER — OXYCODONE HCL 5 MG PO TABS
ORAL_TABLET | ORAL | Status: AC
  Filled 2022-04-29: qty 1

## 2022-04-29 MED ORDER — BUPIVACAINE-EPINEPHRINE 0.25% -1:200000 IJ SOLN
INTRAMUSCULAR | Status: DC | PRN
  Administered 2022-04-29: 20 mL

## 2022-04-29 MED ORDER — LACTATED RINGERS IV SOLN: INTRAVENOUS | Status: DC | PRN

## 2022-04-29 MED ORDER — PHENYLEPHRINE HCL (PRESSORS) 10 MG/ML IV SOLN
INTRAVENOUS | Status: DC | PRN
  Administered 2022-04-29 (×2): 80 ug via INTRAVENOUS
  Administered 2022-04-29: 160 ug via INTRAVENOUS

## 2022-04-29 MED ORDER — BUPIVACAINE-EPINEPHRINE (PF) 0.25% -1:200000 IJ SOLN
INTRAMUSCULAR | Status: AC
  Filled 2022-04-29: qty 30

## 2022-04-29 MED ORDER — CEFAZOLIN SODIUM-DEXTROSE 2-4 GM/100ML-% IV SOLN
2.0000 g | INTRAVENOUS | Status: AC
  Administered 2022-04-29: 2 g via INTRAVENOUS

## 2022-04-29 MED ORDER — PROPOFOL 10 MG/ML IV BOLUS
INTRAVENOUS | Status: DC | PRN
  Administered 2022-04-29: 150 mg via INTRAVENOUS

## 2022-04-29 MED ORDER — SODIUM CHLORIDE 0.9 % IV SOLN: INTRAVENOUS | Status: DC

## 2022-04-29 MED ORDER — ACETAMINOPHEN 10 MG/ML IV SOLN: 1000.0000 mg | Freq: Once | INTRAVENOUS | Status: DC | PRN

## 2022-04-29 MED ORDER — FENTANYL CITRATE (PF) 100 MCG/2ML IJ SOLN
INTRAMUSCULAR | Status: DC | PRN
  Administered 2022-04-29: 50 ug via INTRAVENOUS

## 2022-04-29 MED ORDER — SUGAMMADEX SODIUM 200 MG/2ML IV SOLN
INTRAVENOUS | Status: DC | PRN
Start: 2022-04-29 — End: 2022-04-29
  Administered 2022-04-29: 200 mg via INTRAVENOUS

## 2022-04-29 MED ORDER — PROPOFOL 500 MG/50ML IV EMUL
INTRAVENOUS | Status: AC
  Filled 2022-04-29: qty 50

## 2022-04-29 MED ORDER — ROCURONIUM BROMIDE 100 MG/10ML IV SOLN
INTRAVENOUS | Status: DC | PRN
Start: 2022-04-29 — End: 2022-04-29
  Administered 2022-04-29: 60 mg via INTRAVENOUS

## 2022-04-29 MED ORDER — DROPERIDOL 2.5 MG/ML IJ SOLN: 0.6250 mg | Freq: Once | INTRAMUSCULAR | Status: DC | PRN

## 2022-04-29 MED ORDER — CHLORHEXIDINE GLUCONATE 0.12 % MT SOLN
OROMUCOSAL | Status: AC
  Administered 2022-04-29: 15 mL via OROMUCOSAL
  Filled 2022-04-29: qty 15

## 2022-04-29 MED ORDER — INDOCYANINE GREEN 25 MG IV SOLR
1.2500 mg | Freq: Once | INTRAVENOUS | Status: AC
  Administered 2022-04-29: 1.25 mg via INTRAVENOUS
  Filled 2022-04-29: qty 0.5

## 2022-04-29 MED ORDER — ONDANSETRON HCL 4 MG/2ML IJ SOLN
INTRAMUSCULAR | Status: DC | PRN
Start: 2022-04-29 — End: 2022-04-29
  Administered 2022-04-29: 4 mg via INTRAVENOUS

## 2022-04-29 MED ORDER — FENTANYL CITRATE (PF) 100 MCG/2ML IJ SOLN: 25.0000 ug | INTRAMUSCULAR | Status: DC | PRN

## 2022-04-29 MED ORDER — FAMOTIDINE 20 MG PO TABS
ORAL_TABLET | ORAL | Status: AC
  Administered 2022-04-29: 20 mg via ORAL
  Filled 2022-04-29: qty 1

## 2022-04-29 MED ORDER — MIDAZOLAM HCL 2 MG/2ML IJ SOLN
INTRAMUSCULAR | Status: DC | PRN
  Administered 2022-04-29: 2 mg via INTRAVENOUS

## 2022-04-29 MED ORDER — FENTANYL CITRATE (PF) 100 MCG/2ML IJ SOLN
INTRAMUSCULAR | Status: AC
  Filled 2022-04-29: qty 2

## 2022-04-29 MED ORDER — SUCCINYLCHOLINE CHLORIDE 200 MG/10ML IV SOSY
PREFILLED_SYRINGE | INTRAVENOUS | Status: DC | PRN
Start: 2022-04-29 — End: 2022-04-29
  Administered 2022-04-29: 120 mg via INTRAVENOUS

## 2022-04-29 MED ORDER — CEFAZOLIN SODIUM-DEXTROSE 2-4 GM/100ML-% IV SOLN
INTRAVENOUS | Status: AC
  Filled 2022-04-29: qty 100

## 2022-04-29 MED ORDER — CHLORHEXIDINE GLUCONATE 0.12 % MT SOLN: 15.0000 mL | Freq: Once | OROMUCOSAL | Status: AC

## 2022-04-29 MED ORDER — OXYCODONE HCL 5 MG PO TABS: 5.0000 mg | ORAL_TABLET | ORAL | 0 refills | Status: AC | PRN

## 2022-04-29 MED ORDER — PROMETHAZINE HCL 25 MG/ML IJ SOLN: 6.2500 mg | INTRAMUSCULAR | Status: DC | PRN

## 2022-04-29 MED ORDER — MIDAZOLAM HCL 2 MG/2ML IJ SOLN
INTRAMUSCULAR | Status: AC
  Filled 2022-04-29: qty 2

## 2022-04-29 MED ORDER — OXYCODONE HCL 5 MG PO TABS: 5.0000 mg | ORAL_TABLET | Freq: Four times a day (QID) | ORAL | 0 refills | Status: AC | PRN

## 2022-04-29 MED ORDER — LIDOCAINE HCL (CARDIAC) PF 100 MG/5ML IV SOSY
PREFILLED_SYRINGE | INTRAVENOUS | Status: DC | PRN
Start: 2022-04-29 — End: 2022-04-29
  Administered 2022-04-29: 100 mg via INTRAVENOUS
  Administered 2022-04-29: 70 mg via INTRAVENOUS

## 2022-04-29 MED ORDER — KETOROLAC TROMETHAMINE 30 MG/ML IJ SOLN
INTRAMUSCULAR | Status: DC | PRN
  Administered 2022-04-29: 30 mg via INTRAVENOUS

## 2022-04-29 MED ORDER — OXYCODONE HCL 5 MG/5ML PO SOLN: 5.0000 mg | Freq: Once | ORAL | Status: AC | PRN

## 2022-04-29 MED ORDER — DEXAMETHASONE SODIUM PHOSPHATE 10 MG/ML IJ SOLN
INTRAMUSCULAR | Status: DC | PRN
  Administered 2022-04-29: 10 mg via INTRAVENOUS

## 2022-04-29 MED ORDER — FAMOTIDINE 20 MG PO TABS: 20.0000 mg | ORAL_TABLET | Freq: Once | ORAL | Status: AC

## 2022-04-29 MED ORDER — ORAL CARE MOUTH RINSE: 15.0000 mL | Freq: Once | OROMUCOSAL | Status: AC

## 2022-04-29 MED ORDER — OXYCODONE HCL 5 MG PO TABS
5.0000 mg | ORAL_TABLET | Freq: Once | ORAL | Status: AC | PRN
  Administered 2022-04-29: 5 mg via ORAL

## 2022-04-29 SURGICAL SUPPLY — 58 items
ADH SKN CLS APL DERMABOND .7 (GAUZE/BANDAGES/DRESSINGS) ×2
BAG INFUSER PRESSURE 100CC (MISCELLANEOUS) IMPLANT
BAG RETRIEVAL 10 (BASKET) ×1
BLADE SURG SZ11 CARB STEEL (BLADE) ×3 IMPLANT
CANNULA REDUC XI 12-8 STAPL (CANNULA) ×1
CANNULA REDUCER 12-8 DVNC XI (CANNULA) ×2 IMPLANT
CATH REDDICK CHOLANGI 4FR 50CM (CATHETERS) IMPLANT
CLIP LIGATING HEM O LOK PURPLE (MISCELLANEOUS) IMPLANT
CLIP LIGATING HEMO O LOK GREEN (MISCELLANEOUS) ×3 IMPLANT
DERMABOND ADVANCED (GAUZE/BANDAGES/DRESSINGS) ×1
DERMABOND ADVANCED .7 DNX12 (GAUZE/BANDAGES/DRESSINGS) ×2 IMPLANT
DRAPE ARM DVNC X/XI (DISPOSABLE) ×8 IMPLANT
DRAPE C-ARM XRAY 36X54 (DRAPES) IMPLANT
DRAPE COLUMN DVNC XI (DISPOSABLE) ×2 IMPLANT
DRAPE DA VINCI XI ARM (DISPOSABLE) ×4
DRAPE DA VINCI XI COLUMN (DISPOSABLE) ×1
ELECT REM PT RETURN 9FT ADLT (ELECTROSURGICAL) ×3
ELECTRODE REM PT RTRN 9FT ADLT (ELECTROSURGICAL) ×2 IMPLANT
GLOVE BIO SURGEON STRL SZ 6.5 (GLOVE) ×8 IMPLANT
GLOVE BIOGEL PI IND STRL 6.5 (GLOVE) ×4 IMPLANT
GLOVE BIOGEL PI INDICATOR 6.5 (GLOVE) ×4
GOWN STRL REUS W/ TWL LRG LVL3 (GOWN DISPOSABLE) ×6 IMPLANT
GOWN STRL REUS W/TWL LRG LVL3 (GOWN DISPOSABLE) ×12
GRASPER SUT TROCAR 14GX15 (MISCELLANEOUS) ×3 IMPLANT
IRRIGATOR SUCT 8 DISP DVNC XI (IRRIGATION / IRRIGATOR) IMPLANT
IRRIGATOR SUCTION 8MM XI DISP (IRRIGATION / IRRIGATOR)
IV CATH ANGIO 12GX3 LT BLUE (NEEDLE) IMPLANT
IV NS 1000ML (IV SOLUTION)
IV NS 1000ML BAXH (IV SOLUTION) IMPLANT
KIT PINK PAD W/HEAD ARE REST (MISCELLANEOUS) ×3
KIT PINK PAD W/HEAD ARM REST (MISCELLANEOUS) ×2 IMPLANT
LABEL OR SOLS (LABEL) ×3 IMPLANT
MANIFOLD NEPTUNE II (INSTRUMENTS) ×3 IMPLANT
NDL INSUFFLATION 14GA 120MM (NEEDLE) ×2 IMPLANT
NEEDLE HYPO 22GX1.5 SAFETY (NEEDLE) ×3 IMPLANT
NEEDLE INSUFFLATION 14GA 120MM (NEEDLE) ×3 IMPLANT
NS IRRIG 500ML POUR BTL (IV SOLUTION) ×3 IMPLANT
OBTURATOR OPTICAL LONG 8 DVNC (TROCAR) IMPLANT
OBTURATOR OPTICAL LONG 8MM (TROCAR) ×1
OBTURATOR OPTICAL STANDARD 8MM (TROCAR) ×1
OBTURATOR OPTICAL STND 8 DVNC (TROCAR) ×2
OBTURATOR OPTICALSTD 8 DVNC (TROCAR) ×2 IMPLANT
PACK LAP CHOLECYSTECTOMY (MISCELLANEOUS) ×3 IMPLANT
SEAL CANN UNIV 5-8 DVNC XI (MISCELLANEOUS) ×6 IMPLANT
SEAL XI 5MM-8MM UNIVERSAL (MISCELLANEOUS) ×3
SET TUBE SMOKE EVAC HIGH FLOW (TUBING) ×3 IMPLANT
SOLUTION ELECTROLUBE (MISCELLANEOUS) ×3 IMPLANT
SPIKE FLUID TRANSFER (MISCELLANEOUS) ×6 IMPLANT
SPONGE T-LAP 4X18 ~~LOC~~+RFID (SPONGE) IMPLANT
STAPLER CANNULA SEAL DVNC XI (STAPLE) ×2 IMPLANT
STAPLER CANNULA SEAL XI (STAPLE) ×1
SUT MNCRL 4-0 (SUTURE) ×3
SUT MNCRL 4-0 27XMFL (SUTURE) ×2
SUT VICRYL 0 AB UR-6 (SUTURE) ×3 IMPLANT
SUTURE MNCRL 4-0 27XMF (SUTURE) ×2 IMPLANT
SYS BAG RETRIEVAL 10MM (BASKET) ×2
SYSTEM BAG RETRIEVAL 10MM (BASKET) ×2 IMPLANT
WATER STERILE IRR 500ML POUR (IV SOLUTION) ×2 IMPLANT

## 2022-04-29 NOTE — Anesthesia Preprocedure Evaluation (Addendum)
Anesthesia Evaluation  ?Patient identified by MRN, date of birth, ID band ?Patient awake ? ? ? ?Reviewed: ?Allergy & Precautions, NPO status , Patient's Chart, lab work & pertinent test results ? ?Airway ?Mallampati: III ? ?TM Distance: >3 FB ?Neck ROM: full ? ? ? Dental ? ?(+) Chipped ?  ?Pulmonary ?shortness of breath and with exertion, Current Smoker and Patient abstained from smoking.,  ?  ?Pulmonary exam normal ? ? ? ? ? ? ? Cardiovascular ?Exercise Tolerance: Poor ?METS: 3 - Mets hypertension, Normal cardiovascular exam ? ? ?  ?Neuro/Psych ?negative neurological ROS ? negative psych ROS  ? GI/Hepatic ?Fatty Liver - Labs reviewed in care everywhere ?Cholelithiasis w/o cholecystitis ?  ?Endo/Other  ?diabetes, Well Controlled, Type 2Hypothyroidism A1C 6.8 ? Renal/GU ?  ? ?  ?Musculoskeletal ? ?(+) Arthritis ,  ? Abdominal ?(+) + obese,   ?Peds ? Hematology ?negative hematology ROS ?(+)   ?Anesthesia Other Findings ?Past Medical History: ?No date: Arthritis ?No date: Asthma ?    Comment:  as a child ?No date: Diabetes mellitus without complication (HCC) ?No date: Fatty liver ?No date: Hilar adenopathy ?No date: Hypertension ?No date: Hypothyroidism ?No date: Sepsis (HCC) ?No date: Thyroid disease ?No date: Tobacco abuse ? ?Past Surgical History: ?early 2000s: BREAST BIOPSY; Left ?    Comment:  benign ?No date: TONSILLECTOMY ?No date: TUBAL LIGATION ? ? ? ? Reproductive/Obstetrics ?negative OB ROS ? ?  ? ? ? ? ? ? ? ? ? ? ? ? ? ?  ?  ? ? ? ? ? ?Anesthesia Physical ?Anesthesia Plan ? ?ASA: 3 ? ?Anesthesia Plan: General ETT  ? ?Post-op Pain Management: Toradol IV (intra-op)*, Ofirmev IV (intra-op)* and Regional block*  ? ?Induction: Intravenous ? ?PONV Risk Score and Plan: 3 and Ondansetron, Dexamethasone, Midazolam and Treatment may vary due to age or medical condition ? ?Airway Management Planned: Oral ETT ? ?Additional Equipment:  ? ?Intra-op Plan:  ? ?Post-operative Plan:  Extubation in OR ? ?Informed Consent: I have reviewed the patients History and Physical, chart, labs and discussed the procedure including the risks, benefits and alternatives for the proposed anesthesia with the patient or authorized representative who has indicated his/her understanding and acceptance.  ? ? ? ?Dental advisory given ? ?Plan Discussed with: Anesthesiologist, CRNA and Surgeon ? ?Anesthesia Plan Comments:   ? ? ? ?Anesthesia Quick Evaluation ? ?

## 2022-04-29 NOTE — Op Note (Signed)
Preoperative diagnosis: Cholelithiasis ? ?Postoperative diagnosis: Chronic cholecystitis ? ?Procedure: Robotic Assisted Laparoscopic Cholecystectomy.  ? ?Anesthesia: GETA ?  ?Surgeon: Dr. Hazle Quant ? ?Wound Classification: Clean Contaminated ? ?Indications: Patient is a 61 y.o. female developed right upper quadrant pain and on workup was found to have cholelithiasis with a normal common duct with history of choledocholithiasis episode.  Robotic Assisted Laparoscopic cholecystectomy was elected. ? ?Findings: ?Cirrhotic liver  ?Critical view of safety achieved ?Cystic duct and artery identified, ligated and divided ?Adequate hemostasis ? ? ? ? ? ? ? ? ? ? ?Description of procedure: The patient was placed on the operating table in the supine position. General anesthesia was induced. A time-out was completed verifying correct patient, procedure, site, positioning, and implant(s) and/or special equipment prior to beginning this procedure. An orogastric tube was placed. The abdomen was prepped and draped in the usual sterile fashion.  ?An incision was made in a natural skin line below the umbilicus.  ?The fascia was elevated and the Veress needle inserted. Proper position was confirmed by aspiration and saline meniscus test.  ?The abdomen was insufflated with carbon dioxide to a pressure of 15 mmHg. The patient tolerated insufflation well. A 8-mm trocar was then inserted in optiview fashion.  ?The laparoscope was inserted and the abdomen inspected. No injuries from initial trocar placement were noted. Additional trocars were then inserted in the following locations: an 8-mm trocar in the left lateral abdomen, and another two 8-mm trocars to the right side of the abdomen 5 cm appart. The umbilical trocar was changed to a 12 mm trocar all under direct visualization. The abdomen was inspected and no abnormalities were found. The table was placed in the reverse Trendelenburg position with the right side up. The robotic  arms were docked and target anatomy identified. Instrument inserted under direct visualization.  ?Filmy adhesions between the gallbladder and omentum, duodenum and transverse colon were lysed with electrocautery. The dome of the gallbladder was grasped with a prograsp and retracted over the dome of the liver. The infundibulum was also grasped with an atraumatic grasper and retracted toward the right lower quadrant. This maneuver exposed Calot?s triangle. The peritoneum overlying the gallbladder infundibulum was then incised and the cystic duct and cystic artery identified and circumferentially dissected. Critical view of safety reviewed before ligating any structure. Firefly images taken to visualize biliary ducts. The cystic duct and cystic artery were then doubly clipped and divided close to the gallbladder.  ?The gallbladder was then dissected from its peritoneal attachments by electrocautery. Hemostasis was checked and the gallbladder and contained stones were removed using an endoscopic retrieval bag. The gallbladder was passed off the table as a specimen. There was no evidence of bleeding from the gallbladder fossa or cystic artery or leakage of the bile from the cystic duct stump. Secondary trocars were removed under direct vision. No bleeding was noted. The robotic arms were undoked. The scope was withdrawn and the umbilical trocar removed. The abdomen was allowed to collapse. The fascia of the 49mm trocar sites was closed with figure-of-eight 0 vicryl sutures. The skin was closed with subcuticular sutures of 4-0 monocryl and topical skin adhesive. The orogastric tube was removed.  ?The patient tolerated the procedure well and was taken to the postanesthesia care unit in stable condition.  ? ?Specimen: Gallbladder ? ?Complications: None ? ?EBL: 5 mL ? ?

## 2022-04-29 NOTE — H&P (Signed)
PATIENT PROFILE: ?Danielle Franklin is a 61 y.o. female who presents to OR for cholecystectomy ? ?PCP: Velna Ochs, MD ? ?HISTORY OF PRESENT ILLNESS: ?Danielle Franklin reports she has been feeling a lot of right upper quadrant pain. Pain aggravated by eating food. No specific type of food. Pain radiates to the back in the left upper abdomen. There is no alleviating factors. Patient denies any fever or chills. Patient had a CT scan done by her PCP due to the abdominal pain and only finding is cholelithiasis with no other explanation of her pain. ? ?I reviewed her chart and she was admitted last year for sepsis. She was found with cholelithiasis without sign of cholecystitis as well. At that time she had a bilirubin of 4 mainly direct bilirubin. This resolved on its own. She had an MRCP that did not shows choledocholithiasis. She also had a HIDA scan that was showed patent cystic duct. She was treated with antibiotic therapy and pain medications. She was pain-free for a while until recently that she is having frequent right upper quadrant pain. I personally evaluated the images of the CT scan of the abdomen and pelvis done recently. I also evaluated the images of the MRCP and HIDA scan done on last admission. ? ?Patient also complaining of frequent tiredness. ? ?Patient denies any changes on history or physical exam since last evaluation.  ? ?PROBLEM LIST: ?Problem List Date Reviewed: 03/03/2022  ?Noted  ?Calculus of gallbladder without cholecystitis without obstruction 03/03/2022  ?Controlled type 2 diabetes mellitus with complication, without long-term current use of insulin (CMS-HCC) 07/08/2020  ?Tobacco use 05/30/2019  ?Depression, prolonged 11/08/2017  ?Pure hypercholesterolemia 06/15/2015  ?Hypothyroidism, unspecified 06/15/2015  ?Hypertension 01/17/2015  ?Obesity due to excess calories 01/17/2015  ? ?GENERAL REVIEW OF SYSTEMS:  ? ?General ROS: negative for - chills, fever, weight gain or weight loss. Positive for  tiredness ?Allergy and Immunology ROS: negative for - hives  ?Hematological and Lymphatic ROS: negative for - bleeding problems or bruising, negative for palpable nodes ?Endocrine ROS: negative for - heat or cold intolerance, hair changes ?Respiratory ROS: negative for - cough, shortness of breath or wheezing ?Cardiovascular ROS: no chest pain or palpitations ?GI ROS: negative for nausea, vomiting, positive for abdominal pain ?Musculoskeletal ROS: Positive for - joint swelling or muscle pain ?Neurological ROS: negative for - confusion, syncope ?Dermatological ROS: negative for pruritus and rash ?Psychiatric: negative for anxiety, depression, difficulty sleeping and memory loss ? ?MEDICATIONS: ?Current Outpatient Medications  ?Medication Sig Dispense Refill  ? ARMOUR THYROID 15 mg tablet TAKE 1 TABLET BY MOUTH EVERY MORNING BEFORE BREAKFAST 90 tablet 1  ? ARMOUR THYROID 90 mg tablet TAKE 1 TABLET BY MOUTH EVERY MORNING BEFORE BREAKFAST 90 tablet 1  ? atenoloL-chlorthalidone (TENORETIC) 50-25 mg tablet TAKE 1 TABLET BY MOUTH DAILY 30 tablet 5  ? blood glucose diagnostic (ONETOUCH VERIO TEST STRIPS) test strip 3 (three) times daily 100 each 5  ? blood glucose meter kit as directed 1 each 0  ? glimepiride (AMARYL) 1 MG tablet TAKE 1 TABLET BY MOUTH DAILY WITH BREAKFAST 90 tablet 1  ? ibuprofen (ADVIL ORAL) Take by mouth as needed  ? lancets Use 1 each 3 (three) times daily Use as instructed. 100 each 10  ? lovastatin (MEVACOR) 40 MG tablet TAKE 1 TABLET BY MOUTH DAILY WITH DINNER 30 tablet 5  ? potassium chloride (KLOR-CON) 10 MEQ ER tablet TAKE 1 TABLET BY MOUTH DAILY 90 tablet 1  ? ?No current facility-administered medications for  this visit.  ? ?ALLERGIES: ?Caduet [amlodipine-atorvastatin] ? ?PAST MEDICAL HISTORY: ?Past Medical History:  ?Diagnosis Date  ? Hypertension  ? Thyroid disease  ? ?PAST SURGICAL HISTORY: ?Past Surgical History:  ?Procedure Laterality Date  ? broken leg  ? TUBAL LIGATION  ? ? ?FAMILY  HISTORY: ?Family History  ?Problem Relation Age of Onset  ? Breast cancer Mother  ? High blood pressure (Hypertension) Mother  ? Diabetes type II Mother  ? High blood pressure (Hypertension) Father  ? ? ?SOCIAL HISTORY: ?Social History  ? ?Socioeconomic History  ? Marital status: Married  ?Occupational History  ? 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 Smoking status: Every Day  ?Packs/day: 1.00  ?Years: 40.00  ?Pack years: 40.00  ?Types: Cigarettes  ? Smokeless tobacco: Never  ?Vaping Use  ? Vaping Use: Never used  ?Substance and Sexual Activity  ? Alcohol use: No  ?Alcohol/week: 0.0 standard drinks  ? Sexual activity: Not Currently  ?Birth control/protection: Surgical  ?Comment: Not really  ? ?PHYSICAL EXAM: ?Vitals:  ?04/17/22 0739  ?BP: 128/82  ?Pulse: 86  ? ?Body mass index is 38.97 kg/m?. ?Weight: 99.8 kg (220 lb)  ? ?GENERAL: Alert, active, oriented x3 ? ?HEENT: Pupils equal reactive to light. Extraocular movements are intact. Sclera clear. Palpebral conjunctiva normal red color.Pharynx clear. ? ?NECK: Supple with no palpable mass and no adenopathy. ? ?LUNGS: Sound clear with no rales rhonchi or wheezes. ? ?HEART: Regular rhythm S1 and S2 without murmur. ? ?ABDOMEN: Soft and depressible, nontender with no palpable mass, no hepatomegaly.  ? ?EXTREMITIES: Well-developed well-nourished symmetrical with no dependent edema. ? ?NEUROLOGICAL: Awake alert oriented, facial expression symmetrical, moving all extremities. ? ?REVIEW OF DATA: ?I have reviewed the following data today: ?Appointment on 03/03/2022  ?Component Date Value  ? Glucose 03/03/2022 134 (H)  ? Sodium 03/03/2022 141  ? Potassium 03/03/2022 3.9  ? Chloride 03/03/2022 99  ? Carbon Dioxide (CO2) 03/03/2022 31.0  ? Calcium 03/03/2022 10.0  ? Urea Nitrogen (BUN) 03/03/2022 13  ? Creatinine 03/03/2022 0.7  ? Glomerular Filtration Ra* 03/03/2022 85  ? BUN/Crea Ratio 03/03/2022 18.6  ? Anion Gap w/K 03/03/2022 14.9  ? WBC  (White Blood Cell Co* 03/03/2022 8.6  ? RBC (Red Blood Cell Coun* 03/03/2022 5.27  ? Hemoglobin 03/03/2022 16.4 (H)  ? Hematocrit 03/03/2022 48.8 (H)  ? MCV (Mean Corpuscular Vo* 03/03/2022 92.6  ? MCH (Mean Corpuscular He* 03/03/2022 31.1  ? MCHC (Mean Corpuscular H* 03/03/2022 33.6  ? Platelet Count 03/03/2022 160  ? RDW-CV (Red Cell Distrib* 03/03/2022 13.3  ? MPV (Mean Platelet Volum* 03/03/2022 11.1  ? Neutrophils 03/03/2022 4.63  ? Lymphocytes 03/03/2022 2.94  ? Monocytes 03/03/2022 0.60  ? Eosinophils 03/03/2022 0.34  ? Basophils 03/03/2022 0.07  ? Neutrophil % 03/03/2022 53.8  ? Lymphocyte % 03/03/2022 34.2  ? Monocyte % 03/03/2022 7.0  ? Eosinophil % 03/03/2022 4.0  ? Basophil% 03/03/2022 0.8  ? Immature Granulocyte % 03/03/2022 0.2  ? Immature Granulocyte Cou* 03/03/2022 0.02  ? Hemoglobin A1C 03/03/2022 6.8 (H)  ? Average Blood Glucose (C* 03/03/2022 148  ? Thyroid Stimulating Horm* 03/03/2022 2.965  ?Office Visit on 02/11/2022  ?Component Date Value  ? Glucose 02/11/2022 69 (L)  ? Sodium 02/11/2022 141  ? Potassium 02/11/2022 3.7  ? Chloride 02/11/2022 99  ? Carbon Dioxide (CO2) 02/11/2022 36.2 (H)  ? Urea Nitrogen (BUN) 02/11/2022 10  ? Creatinine 02/11/2022 0.6  ? Glomerular Filtration Ra* 02/11/2022 102  ?  Calcium 02/11/2022 9.3  ? AST 02/11/2022 39  ? ALT 02/11/2022 24  ? Alk Phos (alkaline Phosp* 02/11/2022 66  ? Albumin 02/11/2022 4.1  ? Bilirubin, Total 02/11/2022 0.5  ? Protein, Total 02/11/2022 7.2  ? A/G Ratio 02/11/2022 1.3  ? WBC (White Blood Cell Co* 02/11/2022 9.0  ? RBC (Red Blood Cell Coun* 02/11/2022 5.18  ? Hemoglobin 02/11/2022 16.1 (H)  ? Hematocrit 02/11/2022 48.5 (H)  ? MCV (Mean Corpuscular Vo* 02/11/2022 93.6  ? MCH (Mean Corpuscular He* 02/11/2022 31.1  ? MCHC (Mean Corpuscular H* 02/11/2022 33.2  ? Platelet Count 02/11/2022 206  ? RDW-CV (Red Cell Distrib* 02/11/2022 13.3  ? MPV (Mean Platelet Volum* 02/11/2022 11.3  ? Neutrophils 02/11/2022 4.76  ? Lymphocytes 02/11/2022 3.05   ? Monocytes 02/11/2022 0.69  ? Eosinophils 02/11/2022 0.39  ? Basophils 02/11/2022 0.08  ? Neutrophil % 02/11/2022 53.0  ? Lymphocyte % 02/11/2022 33.9  ? Monocyte % 02/11/2022 7.7  ? Eosinophil % 02/11/2022 4.3  ? Baso

## 2022-04-29 NOTE — Transfer of Care (Addendum)
Immediate Anesthesia Transfer of Care Note ? ?Patient: Danielle Franklin ? ?Procedure(s) Performed: XI ROBOTIC ASSISTED LAPAROSCOPIC CHOLECYSTECTOMY (Abdomen) ?INDOCYANINE GREEN FLUORESCENCE IMAGING (ICG) ? ?Patient Location: PACU ? ?Anesthesia Type:General ? ?Level of Consciousness: drowsy ? ?Airway & Oxygen Therapy: Patient Spontanous Breathing and Patient connected to face mask oxygen ? ?Post-op Assessment: Report given to RN and Post -op Vital signs reviewed and stable ? ?Post vital signs: Reviewed and stable ? ?Last Vitals:  ?Vitals Value Taken Time  ?BP    ?Temp    ?Pulse    ?Resp    ?SpO2    ? ? ?Last Pain:  ?Vitals:  ? 04/29/22 0727  ?TempSrc: Temporal  ?PainSc: 7   ?   ? ?  ? ?Complications: No notable events documented. ?

## 2022-04-29 NOTE — Anesthesia Procedure Notes (Signed)
Procedure Name: Intubation ?Date/Time: 04/29/2022 8:42 AM ?Performed by: Philbert Riser, CRNA ?Pre-anesthesia Checklist: Patient identified, Patient being monitored, Timeout performed, Emergency Drugs available and Suction available ?Patient Re-evaluated:Patient Re-evaluated prior to induction ?Oxygen Delivery Method: Circle system utilized ?Preoxygenation: Pre-oxygenation with 100% oxygen ?Induction Type: IV induction ?Ventilation: Mask ventilation without difficulty ?Laryngoscope Size: 3 and McGraph ?Grade View: Grade I ?Tube type: Oral ?Tube size: 7.0 mm ?Number of attempts: 1 ?Airway Equipment and Method: Stylet ?Placement Confirmation: ETT inserted through vocal cords under direct vision, positive ETCO2 and breath sounds checked- equal and bilateral ?Secured at: 21 cm ?Tube secured with: Tape ?Dental Injury: Teeth and Oropharynx as per pre-operative assessment  ? ? ? ? ?

## 2022-04-29 NOTE — Discharge Instructions (Addendum)
?  Diet: Resume home heart healthy regular diet.  ? ?Activity: No heavy lifting >20 pounds (children, pets, laundry, garbage) or strenuous activity until follow-up, but light activity and walking are encouraged. Do not drive or drink alcohol if taking narcotic pain medications. ? ?Wound care: May shower with soapy water and pat dry (do not rub incisions), but no baths or submerging incision underwater until follow-up. (no swimming)  ? ?Medications: Resume all home medications. For mild to moderate pain: ibuprofen (if no kidney disease). Narcotic pain medications, if prescribed, can be used for severe pain, though may cause nausea, constipation, and drowsiness. If you do not need the narcotic pain medication, you do not need to fill the prescription. ? ?Call office [phone]) at any time if any questions, worsening pain, fevers/chills, bleeding, drainage from incision site, or other concerns. ? ? ?AMBULATORY SURGERY  ?DISCHARGE INSTRUCTIONS ? ? ?The drugs that you were given will stay in your system until tomorrow so for the next 24 hours you should not: ? ?Drive an automobile ?Make any legal decisions ?Drink any alcoholic beverage ? ? ?You may resume regular meals tomorrow.  Today it is better to start with liquids and gradually work up to solid foods. ? ?You may eat anything you prefer, but it is better to start with liquids, then soup and crackers, and gradually work up to solid foods. ? ? ?Please notify your doctor immediately if you have any unusual bleeding, trouble breathing, redness and pain at the surgery site, drainage, fever, or pain not relieved by medication. ? ? ? ?Additional Instructions: ? ? ?Please contact your physician with any problems or Same Day Surgery at [phone], Monday through Friday 6 am to 4 pm, or [place] at [facility] number at [phone]. ? ?

## 2022-04-30 LAB — SURGICAL PATHOLOGY

## 2022-04-30 NOTE — Anesthesia Postprocedure Evaluation (Signed)
Anesthesia Post Note ? ?Patient: Danielle Franklin ? ?Procedure(s) Performed: XI ROBOTIC ASSISTED LAPAROSCOPIC CHOLECYSTECTOMY (Abdomen) ?INDOCYANINE GREEN FLUORESCENCE IMAGING (ICG) ? ?Patient location during evaluation: PACU ?Anesthesia Type: General ?Level of consciousness: awake and alert ?Pain management: pain level controlled ?Vital Signs Assessment: post-procedure vital signs reviewed and stable ?Respiratory status: spontaneous breathing, nonlabored ventilation and respiratory function stable ?Cardiovascular status: blood pressure returned to baseline and stable ?Postop Assessment: no apparent nausea or vomiting ?Anesthetic complications: no ? ? ?No notable events documented. ? ? ?Last Vitals:  ?Vitals:  ? 04/29/22 1131 04/29/22 1215  ?BP: 107/60 (!) 107/59  ?Pulse: 77 72  ?Resp: 16   ?Temp: 36.4 ?C   ?SpO2: 95% 94%  ?  ?Last Pain:  ?Vitals:  ? 04/30/22 0836  ?TempSrc:   ?PainSc: 4   ? ? ?  ?  ?  ?  ?  ?  ? ?Foye Deer ? ? ? ? ?

## 2022-06-15 DIAGNOSIS — K760 Fatty (change of) liver, not elsewhere classified: Secondary | ICD-10-CM | POA: Diagnosis not present

## 2022-06-15 DIAGNOSIS — M5440 Lumbago with sciatica, unspecified side: Secondary | ICD-10-CM | POA: Diagnosis not present

## 2022-06-15 DIAGNOSIS — M549 Dorsalgia, unspecified: Secondary | ICD-10-CM | POA: Diagnosis not present

## 2022-06-15 DIAGNOSIS — M5430 Sciatica, unspecified side: Secondary | ICD-10-CM | POA: Diagnosis not present

## 2022-07-24 DIAGNOSIS — E118 Type 2 diabetes mellitus with unspecified complications: Secondary | ICD-10-CM | POA: Diagnosis not present

## 2022-08-11 ENCOUNTER — Encounter: Payer: Self-pay | Admitting: Emergency Medicine

## 2022-08-11 ENCOUNTER — Other Ambulatory Visit: Payer: Self-pay

## 2022-08-11 ENCOUNTER — Emergency Department: Payer: 59

## 2022-08-11 DIAGNOSIS — R1031 Right lower quadrant pain: Secondary | ICD-10-CM | POA: Diagnosis not present

## 2022-08-11 DIAGNOSIS — E119 Type 2 diabetes mellitus without complications: Secondary | ICD-10-CM | POA: Insufficient documentation

## 2022-08-11 DIAGNOSIS — R0602 Shortness of breath: Secondary | ICD-10-CM | POA: Diagnosis not present

## 2022-08-11 DIAGNOSIS — K760 Fatty (change of) liver, not elsewhere classified: Secondary | ICD-10-CM | POA: Diagnosis not present

## 2022-08-11 DIAGNOSIS — Z7984 Long term (current) use of oral hypoglycemic drugs: Secondary | ICD-10-CM | POA: Insufficient documentation

## 2022-08-11 DIAGNOSIS — I700 Atherosclerosis of aorta: Secondary | ICD-10-CM | POA: Diagnosis not present

## 2022-08-11 DIAGNOSIS — E039 Hypothyroidism, unspecified: Secondary | ICD-10-CM | POA: Diagnosis not present

## 2022-08-11 DIAGNOSIS — F17200 Nicotine dependence, unspecified, uncomplicated: Secondary | ICD-10-CM | POA: Diagnosis not present

## 2022-08-11 DIAGNOSIS — I10 Essential (primary) hypertension: Secondary | ICD-10-CM | POA: Insufficient documentation

## 2022-08-11 DIAGNOSIS — R7401 Elevation of levels of liver transaminase levels: Secondary | ICD-10-CM | POA: Insufficient documentation

## 2022-08-11 DIAGNOSIS — J45909 Unspecified asthma, uncomplicated: Secondary | ICD-10-CM | POA: Insufficient documentation

## 2022-08-11 LAB — CBC WITH DIFFERENTIAL/PLATELET
Abs Immature Granulocytes: 0.02 10*3/uL (ref 0.00–0.07)
Basophils Absolute: 0.1 10*3/uL (ref 0.0–0.1)
Basophils Relative: 1 %
Eosinophils Absolute: 0.3 10*3/uL (ref 0.0–0.5)
Eosinophils Relative: 3 %
HCT: 47.4 % — ABNORMAL HIGH (ref 36.0–46.0)
Hemoglobin: 15.7 g/dL — ABNORMAL HIGH (ref 12.0–15.0)
Immature Granulocytes: 0 %
Lymphocytes Relative: 32 %
Lymphs Abs: 2.9 10*3/uL (ref 0.7–4.0)
MCH: 31.3 pg (ref 26.0–34.0)
MCHC: 33.1 g/dL (ref 30.0–36.0)
MCV: 94.4 fL (ref 80.0–100.0)
Monocytes Absolute: 0.7 10*3/uL (ref 0.1–1.0)
Monocytes Relative: 7 %
Neutro Abs: 5.2 10*3/uL (ref 1.7–7.7)
Neutrophils Relative %: 57 %
Platelets: 150 10*3/uL (ref 150–400)
RBC: 5.02 MIL/uL (ref 3.87–5.11)
RDW: 13.4 % (ref 11.5–15.5)
WBC: 9.1 10*3/uL (ref 4.0–10.5)
nRBC: 0 % (ref 0.0–0.2)

## 2022-08-11 LAB — COMPREHENSIVE METABOLIC PANEL
ALT: 20 U/L (ref 0–44)
AST: 50 U/L — ABNORMAL HIGH (ref 15–41)
Albumin: 4 g/dL (ref 3.5–5.0)
Alkaline Phosphatase: 50 U/L (ref 38–126)
Anion gap: 9 (ref 5–15)
BUN: 12 mg/dL (ref 8–23)
CO2: 31 mmol/L (ref 22–32)
Calcium: 9.9 mg/dL (ref 8.9–10.3)
Chloride: 101 mmol/L (ref 98–111)
Creatinine, Ser: 0.73 mg/dL (ref 0.44–1.00)
GFR, Estimated: >60 mL/min (ref >60)
Glucose, Bld: 145 mg/dL — ABNORMAL HIGH (ref 70–99)
Potassium: 3.7 mmol/L (ref 3.5–5.1)
Sodium: 141 mmol/L (ref 135–145)
Total Bilirubin: 0.8 mg/dL (ref 0.3–1.2)
Total Protein: 7.8 g/dL (ref 6.5–8.1)

## 2022-08-11 LAB — LIPASE, BLOOD: Lipase: 31 U/L (ref 11–51)

## 2022-08-11 LAB — TROPONIN I (HIGH SENSITIVITY): Troponin I (High Sensitivity): 4 ng/L (ref <18)

## 2022-08-11 MED ORDER — IOHEXOL 300 MG/ML  SOLN
100.0000 mL | Freq: Once | INTRAMUSCULAR | Status: AC | PRN
  Administered 2022-08-11: 100 mL via INTRAVENOUS

## 2022-08-11 NOTE — ED Provider Triage Note (Signed)
  Emergency Medicine Provider Triage Evaluation Note  Danielle Franklin , a 61 y.o.female,  was evaluated in triage.  Pt complains of right lower quadrant abdominal pain x2 weeks. patient states it is worse when taking a deep breath.  She is also been having some shortness of breath as well.    Review of Systems  Positive: Abdominal pain, shortness of breath Negative: Denies fever, chest pain, vomiting  Physical Exam  There were no vitals filed for this visit. Gen:   Awake, no distress   Resp:  Normal effort  MSK:   Moves extremities without difficulty  Other:    Medical Decision Making  Given the patient's initial medical screening exam, the following diagnostic evaluation has been ordered. The patient will be placed in the appropriate treatment space, once one is available, to complete the evaluation and treatment. I have discussed the plan of care with the patient and I have advised the patient that an ED physician or mid-level practitioner will reevaluate their condition after the test results have been received, as the results may give them additional insight into the type of treatment they may need.    Diagnostics: Labs, EKG, abdominal CT  Treatments: none immediately   Varney Daily, [place] 08/11/22 1802

## 2022-08-11 NOTE — ED Triage Notes (Signed)
Brought by [facility]. C/o right sided pain X2 weeks with SOB X 1 day. Doctor concerned about possible PE.

## 2022-08-12 ENCOUNTER — Emergency Department
Admission: EM | Admit: 2022-08-12 | Discharge: 2022-08-12 | Disposition: A | Discharge status: Home / Self Care | Payer: 59 | Attending: Emergency Medicine | Admitting: Emergency Medicine

## 2022-08-12 DIAGNOSIS — R1031 Right lower quadrant pain: Secondary | ICD-10-CM

## 2022-08-12 NOTE — Discharge Instructions (Signed)
You may take Tylenol 1000 mg every 6 hours as needed for pain.  I recommend close follow-up with your primary care doctor.

## 2022-08-12 NOTE — ED Provider Notes (Signed)
[facility] Provider Note    Event Date/Time   First MD Initiated Contact with Patient 08/12/22 0404     (approximate)   History   Abdominal Pain   HPI  Danielle Franklin is a 61 y.o. female with history of diabetes, hypertension, hypothyroidism who presents to the emergency department with right lower quadrant abdominal pain ongoing for 2 weeks.  States pain is worse when she takes a deep breath and also when she lies on her left side.  She denies that her abdomen hurts more with palpation.  She denies any pain with movement of the right hip.  No fevers, nausea, vomiting, diarrhea, dysuria, hematuria, vaginal bleeding or discharge.  She had a cholecystectomy in May 2023.  She denies any chest pain or upper abdominal pain or flank pain.  No history of PE or DVT.  No shortness of breath.   History provided by patient.    Past Medical History:  Diagnosis Date   Arthritis    Asthma    as a child   Diabetes mellitus without complication (HCC)    Fatty liver    Hilar adenopathy    Hypertension    Hypothyroidism    Sepsis (HCC)    Thyroid disease    Tobacco abuse     Past Surgical History:  Procedure Laterality Date   BREAST BIOPSY Left early 2000s   benign   TONSILLECTOMY     TUBAL LIGATION      MEDICATIONS:  Prior to Admission medications   Medication Sig Start Date End Date Taking? Authorizing Provider  acetaminophen (TYLENOL 8 HOUR ARTHRITIS PAIN) 650 MG CR tablet Take 650 mg by mouth every 8 (eight) hours as needed for pain.    [provider]  atenolol-chlorthalidone (TENORETIC) 50-25 MG tablet Take 1 tablet by mouth at bedtime. 02/02/22   [provider]  cyclobenzaprine (FLEXERIL) 5 MG tablet Take 5 mg by mouth 2 (two) times daily as needed for muscle spasms.    [provider]  glimepiride (AMARYL) 1 MG tablet Take 1 mg by mouth daily with breakfast. 04/09/21   [provider]  GLUCOSAMINE HCL PO Take 1  tablet by mouth as needed.    [provider]  ibuprofen (ADVIL) 200 MG tablet Take 200-400 mg by mouth every 8 (eight) hours as needed for moderate pain.    [provider]  lovastatin (MEVACOR) 40 MG tablet Take 1 tablet by mouth at bedtime. 05/15/15   [provider]  MAGNESIUM OXIDE PO Take 1 tablet by mouth as needed.    [provider]  oxyCODONE (OXY IR/ROXICODONE) 5 MG immediate release tablet Take 1 tablet (5 mg total) by mouth every 6 (six) hours as needed for severe pain. 04/29/22   Carolan Shiver, MD  oxyCODONE (ROXICODONE) 5 MG immediate release tablet Take 1 tablet (5 mg total) by mouth every 4 (four) hours as needed for severe pain. 04/29/22   Carolan Shiver, MD  potassium chloride (KLOR-CON) 10 MEQ tablet Take 10 mEq by mouth at bedtime. 02/11/22   [provider]  thyroid (ARMOUR) 15 MG tablet Take 15 mg by mouth every morning. Take with 90 mg 05/15/15   [provider]  thyroid (ARMOUR) 90 MG tablet Take 90 mg by mouth every morning. Take with 15 mg    [provider]  vitamin C (ASCORBIC ACID) 500 MG tablet Take 500 mg by mouth daily.    [provider]  VITAMIN  D PO Take 1 tablet by mouth as needed.    [provider]    Physical Exam   Triage Vital Signs: ED Triage Vitals  Enc Vitals Group     BP 08/11/22 1821 126/72     Pulse Rate 08/11/22 1821 96     Resp 08/11/22 1821 18     Temp 08/11/22 1821 98.3 F (36.8 C)     Temp Source 08/11/22 1821 Oral     SpO2 08/11/22 1821 97 %     Weight 08/12/22 0407 222 lb 10.6 oz (101 kg)     Height 08/12/22 0407 5\' 3"  (1.6 m)     Head Circumference --      Peak Flow --      Pain Score 08/11/22 1831 10     Pain Loc --      Pain Edu? --      Excl. in GC? --     Most recent vital signs: Vitals:   08/11/22 2323 08/12/22 0407  BP: (!) 143/79 131/80  Pulse: 95 87  Resp: 20 18  Temp: 98.3 F (36.8 C) 98.2 F (36.8 C)  SpO2: 95% 97%     CONSTITUTIONAL: Alert and oriented and responds appropriately to questions. Well-appearing; well-nourished HEAD: Normocephalic, atraumatic EYES: Conjunctivae clear, pupils appear equal, sclera nonicteric ENT: normal nose; moist mucous membranes NECK: Supple, normal ROM CARD: RRR; S1 and S2 appreciated; no murmurs, no clicks, no rubs, no gallops RESP: Normal chest excursion without splinting or tachypnea; breath sounds clear and equal bilaterally; no wheezes, no rhonchi, no rales, no hypoxia or respiratory distress, speaking full sentences ABD/GI: Normal bowel sounds; non-distended; soft, non-tender, no rebound, no guarding, no peritoneal signs, no tenderness at McBurney's point BACK: The back appears normal, no flank pain EXT: Normal ROM in all joints; no deformity noted, no edema; no cyanosis, no calf tenderness or calf swelling, extremities are warm and well-perfused, compartments of the right leg are soft, no pain with flexion or extension of the right hip or internal or external rotation SKIN: Normal color for age and race; warm; no rash on exposed skin NEURO: Moves all extremities equally, normal speech PSYCH: The patient's mood and manner are appropriate.   ED Results / Procedures / Treatments   LABS: (all labs ordered are listed, but only abnormal results are displayed) Labs Reviewed  COMPREHENSIVE METABOLIC PANEL - Abnormal; Notable for the following components:      Result Value   Glucose, Bld 145 (*)    AST 50 (*)    All other components within normal limits  CBC WITH DIFFERENTIAL/PLATELET - Abnormal; Notable for the following components:   Hemoglobin 15.7 (*)    HCT 47.4 (*)    All other components within normal limits  LIPASE, BLOOD  URINALYSIS, ROUTINE W REFLEX MICROSCOPIC  TROPONIN I (HIGH SENSITIVITY)     EKG:  EKG Interpretation  Date/Time:  Tuesday August 11 2022 23:34:49 EDT Ventricular Rate:  96 PR Interval:  148 QRS Duration: 72 QT  Interval:  360 QTC Calculation: 454 R Axis:   29 Text Interpretation: Normal sinus rhythm Low voltage QRS Cannot rule out Anterior infarct , age undetermined Abnormal ECG When compared with ECG of 27-Apr-2022 14:41, Minimal criteria for Anterior infarct are now Present Confirmed by 29-Apr-2022 [id number]) on 08/12/2022 4:05:00 AM         RADIOLOGY: My personal review and interpretation of imaging: This x-ray clear.  CT of the abdomen pelvis shows no acute abnormality.  I have personally reviewed all radiology reports.   DG Chest 2 View  Result Date: 08/11/2022 CLINICAL DATA:  Shortness of breath. EXAM: CHEST - 2 VIEW COMPARISON:  Chest x-ray 05/05/2021 FINDINGS: The heart size and mediastinal contours are within normal limits. Both lungs are clear. The visualized skeletal structures are unremarkable. IMPRESSION: No active cardiopulmonary disease. Electronically Signed   By: Darliss Cheney M.D.   On: 08/11/2022 20:58   CT Abdomen Pelvis W Contrast  Result Date: 08/11/2022 CLINICAL DATA:  Right lower quadrant pain. EXAM: CT ABDOMEN AND PELVIS WITH CONTRAST TECHNIQUE: Multidetector CT imaging of the abdomen and pelvis was performed using the standard protocol following bolus administration of intravenous contrast. RADIATION DOSE REDUCTION: This exam was performed according to the departmental dose-optimization program which includes automated exposure control, adjustment of the mA and/or kV according to patient size and/or use of iterative reconstruction technique. CONTRAST:  OMNIPAQUE IOHEXOL 300 MG/ML  SOLN COMPARISON:  CT abdomen and pelvis 02/26/2022 FINDINGS: Lower chest: No acute abnormality. Hepatobiliary: There is diffuse fatty infiltration of the liver. The liver is enlarged, unchanged. No focal liver lesions are seen. Gallbladder is not seen. There is no biliary ductal dilatation. Pancreas: Unremarkable. No pancreatic ductal dilatation or surrounding inflammatory changes. Spleen: Normal  in size without focal abnormality. Adrenals/Urinary Tract: Adrenal glands are unremarkable. Kidneys are normal, without renal calculi, focal lesion, or hydronephrosis. Bladder is unremarkable. Stomach/Bowel: Stomach is within normal limits. Appendix appears normal. No evidence of bowel wall thickening, distention, or inflammatory changes. There are scattered colonic diverticula. Duodenal diverticulum is present. This is similar to the prior study. Vascular/Lymphatic: Aortic atherosclerosis. No enlarged abdominal or pelvic lymph nodes. Reproductive: Uterus and bilateral adnexa are unremarkable. Other: No ascites. There is a small fat containing umbilical hernia. Musculoskeletal: No acute or significant osseous findings. IMPRESSION: 1. No acute localizing process in the abdomen or pelvis. 2. Stable hepatomegaly with hepatic steatosis. 3. Colonic diverticulosis without evidence for diverticulitis. 4. Duodenal diverticulum. 5.  Aortic Atherosclerosis (ICD10-I70.0). Electronically Signed   By: Darliss Cheney M.D.   On: 08/11/2022 19:46     PROCEDURES:  Critical Care performed: No     Procedures    IMPRESSION / MDM / ASSESSMENT AND PLAN / ED COURSE  I reviewed the triage vital signs and the nursing notes.    Patient here with right lower quadrant pain ongoing for 2 weeks.  Seen by her primary care doctor's office and sent here for further evaluation.  States she saw Dr. Wallene Huh who is not her normal doctor is Dr. Letitia Libra was not in the office today.     DIFFERENTIAL DIAGNOSIS (includes but not limited to):   Appendicitis, UTI, pyelonephritis, kidney stone, colitis   Patient's presentation is most consistent with acute presentation with potential threat to life or bodily function.   PLAN: CBC, CMP, lipase, troponin obtained from triage.  No acute abnormality seen.  No leukocytosis.  Normal electrolytes, renal function.  Minimally elevated AST which is chronic for her and improving.  Otherwise  LFTs normal.  Lipase normal.  Troponin negative.  Chest x-ray and CT of the abdomen pelvis also obtained.  I have reviewed and interpreted these images myself.  Chest x-ray shows no acute abnormality.  CT of the abdomen pelvis shows a normal appendix and no other acute abnormality.  Patient's abdominal exam today is benign.  She mentions that the doctor at her PCPs office today was concerned about possible PE given the pleuritic nature of her pain  but this seems very atypical as the pain is located in the right inguinal area.  She has no hernia here.  She has no pain with manipulation of the hip and is able to ambulate.  We discussed getting a CTA of the chest to completely rule out PE as well as a possible ultrasound of the right leg to rule out DVT.  Patient has also not provided [place] with a urine sample.  She states she only came here because she was told she need to have CT scan done and is reassured that her work-up thus far has been unremarkable.  She declines any other further work-up today and would prefer to follow-up with her doctor when he is back in the office.  Have recommended Tylenol over-the-counter for pain control discussed at length return precautions.  Have again offered to obtain urinalysis, CTA of the chest and ultrasound of the right leg but patient again declines.  My suspicion for PE and DVT in this patient is extremely low.  She is hemodynamically stable here.  She is not having any urinary symptoms.  Will discharge with close outpatient follow-up.  MEDICATIONS GIVEN IN ED: Medications  iohexol (OMNIPAQUE) 300 MG/ML solution 100 mL (100 mLs Intravenous Contrast Given 08/11/22 1921)     ED COURSE:  At this time, I do not feel there is any life-threatening condition present. I reviewed all nursing notes, vitals, pertinent previous records.  All lab and urine results, EKGs, imaging ordered have been independently reviewed and interpreted by myself.  I reviewed all available radiology  reports from any imaging ordered this visit.  Based on my assessment, I feel the patient is safe to be discharged home without further emergent workup and can continue workup as an outpatient as needed. Discussed all findings, treatment plan as well as usual and customary return precautions.  They verbalize understanding and are comfortable with this plan.  Outpatient follow-up has been provided as needed.  All questions have been answered.    CONSULTS: Admission considered but work-up has been reassuring and patient refuses further work-up stating she would like to follow-up with her primary care doctor as an outpatient.   OUTSIDE RECORDS REVIEWED: Reviewed patient's note with Dr. Wallene Huh today at [facility] clinic.       FINAL CLINICAL IMPRESSION(S) / ED DIAGNOSES   Final diagnoses:  RLQ abdominal pain     Rx / DC Orders   ED Discharge Orders     None        Note:  This document was prepared using Dragon voice recognition software and may include unintentional dictation errors.   Domnic Vantol, Layla Maw, DO 08/12/22 [phone]

## 2022-08-12 NOTE — ED Notes (Signed)
E-signature pad unavailable - Pt verbalized understanding of D/C information - no additional concerns at this time.  

## 2022-08-18 DIAGNOSIS — R1031 Right lower quadrant pain: Secondary | ICD-10-CM | POA: Diagnosis not present

## 2022-08-18 DIAGNOSIS — R300 Dysuria: Secondary | ICD-10-CM | POA: Diagnosis not present

## 2022-08-19 DIAGNOSIS — R300 Dysuria: Secondary | ICD-10-CM | POA: Diagnosis not present

## 2022-11-04 ENCOUNTER — Other Ambulatory Visit: Payer: Self-pay | Admitting: Internal Medicine

## 2022-11-04 DIAGNOSIS — Z1231 Encounter for screening mammogram for malignant neoplasm of breast: Secondary | ICD-10-CM

## 2022-11-27 DIAGNOSIS — E118 Type 2 diabetes mellitus with unspecified complications: Secondary | ICD-10-CM | POA: Diagnosis not present

## 2022-12-04 ENCOUNTER — Ambulatory Visit
Admission: RE | Admit: 2022-12-04 | Discharge: 2022-12-04 | Disposition: A | Discharge status: Home / Self Care | Payer: 59 | Source: Ambulatory Visit | Attending: Internal Medicine | Admitting: Internal Medicine

## 2022-12-04 DIAGNOSIS — Z1231 Encounter for screening mammogram for malignant neoplasm of breast: Secondary | ICD-10-CM

## 2022-12-09 ENCOUNTER — Other Ambulatory Visit: Payer: Self-pay | Admitting: Internal Medicine

## 2022-12-09 DIAGNOSIS — R928 Other abnormal and inconclusive findings on diagnostic imaging of breast: Secondary | ICD-10-CM

## 2022-12-09 DIAGNOSIS — N63 Unspecified lump in unspecified breast: Secondary | ICD-10-CM

## 2022-12-09 DIAGNOSIS — R921 Mammographic calcification found on diagnostic imaging of breast: Secondary | ICD-10-CM

## 2022-12-23 ENCOUNTER — Ambulatory Visit
Admission: RE | Admit: 2022-12-23 | Discharge: 2022-12-23 | Disposition: A | Discharge status: Home / Self Care | Payer: 59 | Source: Ambulatory Visit | Attending: Internal Medicine | Admitting: Internal Medicine

## 2022-12-23 DIAGNOSIS — R921 Mammographic calcification found on diagnostic imaging of breast: Secondary | ICD-10-CM

## 2022-12-23 DIAGNOSIS — N63 Unspecified lump in unspecified breast: Secondary | ICD-10-CM

## 2022-12-23 DIAGNOSIS — R928 Other abnormal and inconclusive findings on diagnostic imaging of breast: Secondary | ICD-10-CM | POA: Diagnosis not present

## 2022-12-23 DIAGNOSIS — N644 Mastodynia: Secondary | ICD-10-CM | POA: Diagnosis not present

## 2023-02-22 DIAGNOSIS — J439 Emphysema, unspecified: Secondary | ICD-10-CM | POA: Diagnosis not present

## 2023-02-22 DIAGNOSIS — E785 Hyperlipidemia, unspecified: Secondary | ICD-10-CM | POA: Diagnosis not present

## 2023-02-22 DIAGNOSIS — I10 Essential (primary) hypertension: Secondary | ICD-10-CM | POA: Diagnosis not present

## 2023-02-22 DIAGNOSIS — Z833 Family history of diabetes mellitus: Secondary | ICD-10-CM | POA: Diagnosis not present

## 2023-02-22 DIAGNOSIS — K219 Gastro-esophageal reflux disease without esophagitis: Secondary | ICD-10-CM | POA: Diagnosis not present

## 2023-02-22 DIAGNOSIS — Z8249 Family history of ischemic heart disease and other diseases of the circulatory system: Secondary | ICD-10-CM | POA: Diagnosis not present

## 2023-02-22 DIAGNOSIS — F17200 Nicotine dependence, unspecified, uncomplicated: Secondary | ICD-10-CM | POA: Diagnosis not present

## 2023-02-22 DIAGNOSIS — E039 Hypothyroidism, unspecified: Secondary | ICD-10-CM | POA: Diagnosis not present

## 2023-02-22 DIAGNOSIS — E119 Type 2 diabetes mellitus without complications: Secondary | ICD-10-CM | POA: Diagnosis not present

## 2023-02-22 DIAGNOSIS — Z6836 Body mass index (BMI) 36.0-36.9, adult: Secondary | ICD-10-CM | POA: Diagnosis not present

## 2023-02-22 DIAGNOSIS — M1990 Unspecified osteoarthritis, unspecified site: Secondary | ICD-10-CM | POA: Diagnosis not present

## 2023-02-22 DIAGNOSIS — Z888 Allergy status to other drugs, medicaments and biological substances status: Secondary | ICD-10-CM | POA: Diagnosis not present

## 2023-03-16 DIAGNOSIS — R202 Paresthesia of skin: Secondary | ICD-10-CM | POA: Diagnosis not present

## 2023-03-30 DIAGNOSIS — E538 Deficiency of other specified B group vitamins: Secondary | ICD-10-CM | POA: Diagnosis not present

## 2023-04-07 DIAGNOSIS — E118 Type 2 diabetes mellitus with unspecified complications: Secondary | ICD-10-CM | POA: Diagnosis not present

## 2023-04-07 DIAGNOSIS — E7800 Pure hypercholesterolemia, unspecified: Secondary | ICD-10-CM | POA: Diagnosis not present

## 2023-04-07 DIAGNOSIS — F17210 Nicotine dependence, cigarettes, uncomplicated: Secondary | ICD-10-CM | POA: Diagnosis not present

## 2023-04-07 DIAGNOSIS — I10 Essential (primary) hypertension: Secondary | ICD-10-CM | POA: Diagnosis not present

## 2023-04-07 DIAGNOSIS — E538 Deficiency of other specified B group vitamins: Secondary | ICD-10-CM | POA: Diagnosis not present

## 2023-04-07 DIAGNOSIS — F32A Depression, unspecified: Secondary | ICD-10-CM | POA: Diagnosis not present

## 2023-04-07 DIAGNOSIS — E034 Atrophy of thyroid (acquired): Secondary | ICD-10-CM | POA: Diagnosis not present

## 2023-04-13 DIAGNOSIS — E538 Deficiency of other specified B group vitamins: Secondary | ICD-10-CM | POA: Diagnosis not present

## 2023-04-20 DIAGNOSIS — E538 Deficiency of other specified B group vitamins: Secondary | ICD-10-CM | POA: Diagnosis not present

## 2023-08-11 DIAGNOSIS — Z720 Tobacco use: Secondary | ICD-10-CM | POA: Diagnosis not present

## 2023-08-11 DIAGNOSIS — E034 Atrophy of thyroid (acquired): Secondary | ICD-10-CM | POA: Diagnosis not present

## 2023-08-11 DIAGNOSIS — I10 Essential (primary) hypertension: Secondary | ICD-10-CM | POA: Diagnosis not present

## 2023-08-11 DIAGNOSIS — F32A Depression, unspecified: Secondary | ICD-10-CM | POA: Diagnosis not present

## 2023-08-11 DIAGNOSIS — E118 Type 2 diabetes mellitus with unspecified complications: Secondary | ICD-10-CM | POA: Diagnosis not present

## 2023-08-11 DIAGNOSIS — E7800 Pure hypercholesterolemia, unspecified: Secondary | ICD-10-CM | POA: Diagnosis not present

## 2023-08-11 DIAGNOSIS — Z1331 Encounter for screening for depression: Secondary | ICD-10-CM | POA: Diagnosis not present

## 2023-08-11 DIAGNOSIS — Z1339 Encounter for screening examination for other mental health and behavioral disorders: Secondary | ICD-10-CM | POA: Diagnosis not present

## 2023-08-11 DIAGNOSIS — Z0001 Encounter for general adult medical examination with abnormal findings: Secondary | ICD-10-CM | POA: Diagnosis not present

## 2023-11-17 ENCOUNTER — Other Ambulatory Visit: Payer: Self-pay | Admitting: Internal Medicine

## 2023-11-17 DIAGNOSIS — Z1231 Encounter for screening mammogram for malignant neoplasm of breast: Secondary | ICD-10-CM

## 2023-12-28 DIAGNOSIS — E118 Type 2 diabetes mellitus with unspecified complications: Secondary | ICD-10-CM | POA: Diagnosis not present

## 2023-12-28 DIAGNOSIS — E034 Atrophy of thyroid (acquired): Secondary | ICD-10-CM | POA: Diagnosis not present

## 2024-02-15 ENCOUNTER — Ambulatory Visit
Admission: RE | Admit: 2024-02-15 | Discharge: 2024-02-15 | Disposition: A | Discharge status: Home / Self Care | Payer: 59 | Source: Ambulatory Visit | Attending: Internal Medicine | Admitting: Internal Medicine

## 2024-02-15 DIAGNOSIS — Z1231 Encounter for screening mammogram for malignant neoplasm of breast: Secondary | ICD-10-CM | POA: Diagnosis present

## 2025-01-30 ENCOUNTER — Other Ambulatory Visit: Payer: Self-pay | Admitting: Internal Medicine

## 2025-01-30 DIAGNOSIS — Z1231 Encounter for screening mammogram for malignant neoplasm of breast: Secondary | ICD-10-CM

## 2025-02-20 ENCOUNTER — Encounter
